# Patient Record
Sex: Female | Born: 1955
Health system: Southern US, Community
[De-identification: ages and names within clinical notes are randomized; demographics above are authoritative.]

## PROBLEM LIST (undated history)

## (undated) DIAGNOSIS — G43109 Migraine with aura, not intractable, without status migrainosus: Secondary | ICD-10-CM

## (undated) DIAGNOSIS — E039 Hypothyroidism, unspecified: Secondary | ICD-10-CM

## (undated) DIAGNOSIS — IMO0002 Reserved for concepts with insufficient information to code with codable children: Secondary | ICD-10-CM

## (undated) DIAGNOSIS — M810 Age-related osteoporosis without current pathological fracture: Secondary | ICD-10-CM

## (undated) DIAGNOSIS — N900 Mild vulvar dysplasia: Secondary | ICD-10-CM

## (undated) HISTORY — DX: Migraine with aura, not intractable, without status migrainosus: G43.109

## (undated) HISTORY — DX: Hypothyroidism, unspecified: E03.9

## (undated) HISTORY — DX: Age-related osteoporosis without current pathological fracture: M81.0

## (undated) HISTORY — DX: Reserved for concepts with insufficient information to code with codable children: IMO0002

## (undated) HISTORY — PX: DILATION AND CURETTAGE OF UTERUS: SHX78

## (undated) HISTORY — DX: Mild vulvar dysplasia: N90.0

---

## 2001-02-20 ENCOUNTER — Other Ambulatory Visit: Admission: RE | Admit: 2001-02-20 | Discharge: 2001-02-20 | Payer: Self-pay | Admitting: *Deleted

## 2002-01-11 DIAGNOSIS — R87619 Unspecified abnormal cytological findings in specimens from cervix uteri: Secondary | ICD-10-CM

## 2002-01-11 DIAGNOSIS — IMO0002 Reserved for concepts with insufficient information to code with codable children: Secondary | ICD-10-CM

## 2002-01-11 HISTORY — DX: Reserved for concepts with insufficient information to code with codable children: IMO0002

## 2002-01-11 HISTORY — DX: Unspecified abnormal cytological findings in specimens from cervix uteri: R87.619

## 2002-02-08 ENCOUNTER — Other Ambulatory Visit: Admission: RE | Admit: 2002-02-08 | Discharge: 2002-02-08 | Payer: Self-pay | Admitting: *Deleted

## 2003-02-18 ENCOUNTER — Other Ambulatory Visit: Admission: RE | Admit: 2003-02-18 | Discharge: 2003-02-18 | Payer: Self-pay | Admitting: Obstetrics and Gynecology

## 2004-02-24 ENCOUNTER — Other Ambulatory Visit: Admission: RE | Admit: 2004-02-24 | Discharge: 2004-02-24 | Payer: Self-pay | Admitting: Obstetrics and Gynecology

## 2005-03-11 ENCOUNTER — Other Ambulatory Visit: Admission: RE | Admit: 2005-03-11 | Discharge: 2005-03-11 | Payer: Self-pay | Admitting: Obstetrics and Gynecology

## 2005-03-27 DIAGNOSIS — Z8739 Personal history of other diseases of the musculoskeletal system and connective tissue: Secondary | ICD-10-CM | POA: Insufficient documentation

## 2006-03-20 ENCOUNTER — Other Ambulatory Visit: Admission: RE | Admit: 2006-03-20 | Discharge: 2006-03-20 | Payer: Self-pay | Admitting: Obstetrics & Gynecology

## 2006-10-12 DIAGNOSIS — N951 Menopausal and female climacteric states: Secondary | ICD-10-CM | POA: Insufficient documentation

## 2007-03-24 ENCOUNTER — Other Ambulatory Visit: Admission: RE | Admit: 2007-03-24 | Discharge: 2007-03-24 | Payer: Self-pay | Admitting: Obstetrics and Gynecology

## 2008-02-29 ENCOUNTER — Encounter: Admission: RE | Admit: 2008-02-29 | Discharge: 2008-02-29 | Payer: Self-pay | Admitting: Family Medicine

## 2008-03-17 ENCOUNTER — Other Ambulatory Visit: Admission: RE | Admit: 2008-03-17 | Discharge: 2008-03-17 | Payer: Self-pay | Admitting: Obstetrics & Gynecology

## 2008-04-25 LAB — HM COLONOSCOPY

## 2009-05-25 ENCOUNTER — Encounter: Admission: RE | Admit: 2009-05-25 | Discharge: 2009-05-25 | Payer: Self-pay | Admitting: Obstetrics and Gynecology

## 2010-06-11 ENCOUNTER — Encounter
Admission: RE | Admit: 2010-06-11 | Discharge: 2010-06-11 | Payer: Self-pay | Source: Home / Self Care | Attending: Family Medicine | Admitting: Family Medicine

## 2011-05-14 HISTORY — PX: FOOT SURGERY: SHX648

## 2012-05-27 ENCOUNTER — Other Ambulatory Visit: Payer: Self-pay | Admitting: Obstetrics and Gynecology

## 2012-05-27 ENCOUNTER — Other Ambulatory Visit: Payer: Self-pay | Admitting: Family Medicine

## 2012-05-27 DIAGNOSIS — Z1231 Encounter for screening mammogram for malignant neoplasm of breast: Secondary | ICD-10-CM

## 2012-06-09 ENCOUNTER — Encounter: Payer: Self-pay | Admitting: *Deleted

## 2012-06-17 ENCOUNTER — Encounter: Payer: Self-pay | Admitting: Obstetrics and Gynecology

## 2012-06-19 ENCOUNTER — Ambulatory Visit
Admission: RE | Admit: 2012-06-19 | Discharge: 2012-06-19 | Disposition: A | Discharge status: Home / Self Care | Payer: BC Managed Care – PPO | Source: Ambulatory Visit | Attending: Obstetrics and Gynecology | Admitting: Obstetrics and Gynecology

## 2012-06-19 DIAGNOSIS — Z1231 Encounter for screening mammogram for malignant neoplasm of breast: Secondary | ICD-10-CM

## 2012-07-30 ENCOUNTER — Encounter: Payer: Self-pay | Admitting: Nurse Practitioner

## 2012-07-30 DIAGNOSIS — J069 Acute upper respiratory infection, unspecified: Secondary | ICD-10-CM | POA: Insufficient documentation

## 2012-07-31 ENCOUNTER — Encounter: Payer: Self-pay | Admitting: Nurse Practitioner

## 2012-07-31 ENCOUNTER — Ambulatory Visit (INDEPENDENT_AMBULATORY_CARE_PROVIDER_SITE_OTHER): Payer: BC Managed Care – PPO | Admitting: Nurse Practitioner

## 2012-07-31 VITALS — BP 106/64 | Ht 63.25 in | Wt 161.6 lb

## 2012-07-31 DIAGNOSIS — Z8739 Personal history of other diseases of the musculoskeletal system and connective tissue: Secondary | ICD-10-CM

## 2012-07-31 DIAGNOSIS — N951 Menopausal and female climacteric states: Secondary | ICD-10-CM

## 2012-07-31 DIAGNOSIS — Z01419 Encounter for gynecological examination (general) (routine) without abnormal findings: Secondary | ICD-10-CM

## 2012-07-31 DIAGNOSIS — J069 Acute upper respiratory infection, unspecified: Secondary | ICD-10-CM

## 2012-07-31 MED ORDER — AZITHROMYCIN 250 MG PO TABS: ORAL_TABLET | ORAL | Status: DC

## 2012-07-31 NOTE — Progress Notes (Signed)
57 y.o. MG1P1Caucasian Fe here for annual exam.  Some problems with vaso symptoms that are mostly tolerable. Still has some insomnia that is chronic.Takes no medications. Still some vaginal dryness. OTC lubrications prn. Recent URI symptoms X 1 month. Chest congestion and cough. Main lingering symptom is cough. No current fever. OTC Dayquil and Nyquil.  She doesn't have appointment. with PCP until next month. Request medication treatment.   Daughter is now pregnant and will be returning to the states from [place]. She had triplets in the past. Husband is no longer driving as truck driver and has returned to [employer] and she is much happier with this choice.  Patient's last menstrual period was 10/12/2006.          Sexually active: yes  The current method of family planning is post menopausal status.    Exercising: yes  patient states exercising very little. Smoker:  no  Health Maintenance: Pap:  07/29/11 normal with negative HR HPV. MMG:  06/19/12 normal  Colonoscopy:  2010 [facility] return in 10 years. BMD:   11/06  TDaP:  Patient will check with PCP next month.    Past Medical History  Diagnosis Date  . Osteoporosis   . Abnormal pap 01/2002    cervicitis    Past Surgical History  Procedure Laterality Date  . Foot surgery  2013    left foot    Current Outpatient Prescriptions  Medication Sig Dispense Refill  . Calcium 1200-1000 MG-UNIT CHEW Chew by mouth.      . Cholecalciferol (VITAMIN D-3) 1000 UNITS CAPS Take by mouth.      Marland Kitchen FLUoxetine (PROZAC) 10 MG capsule Take 10 mg by mouth daily.      Marland Kitchen lovastatin (MEVACOR) 40 MG tablet Take 40 mg by mouth at bedtime.      . Omega-3 Fatty Acids (FISH OIL PO) Take by mouth.      . propranolol (INDERAL) 10 MG tablet Take 10 mg by mouth daily.       . SUMATRIPTAN SUCCINATE PO Take by mouth.       No current facility-administered medications for this visit.    Family History  Problem Relation Age of Onset  . Cancer  Father     skin  . Diabetes Maternal Uncle   . Cancer Maternal Grandfather     colon    ROS:  Pertinent items are noted in HPI.  Otherwise, a comprehensive ROS was negative.  Exam:   BP 106/64  Ht 5' 3.25" (1.607 m)  Wt 161 lb 9.6 oz (73.301 kg)  BMI 28.38 kg/m2  LMP 10/12/2006  Weight change: of 1 lb. Height:   Height: 5' 3.25" (160.7 cm)  Ht Readings from Last 3 Encounters:  07/31/12 5' 3.25" (1.607 m)    General appearance: alert, cooperative and appears stated age Head: Normocephalic, without obvious abnormality, atraumatic Neck: no adenopathy, supple, symmetrical, trachea midline and thyroid not enlarged, symmetric, no tenderness/mass/nodules Lungs: Bilateral rhonchi with a loose congested cough. No rales. Breasts: Inspection negative, No nipple retraction or dimpling, No nipple discharge or bleeding, No axillary or supraclavicular adenopathy, Normal to palpation without dominant masses Heart: regular rate and rhythm Abdomen: soft, non-tender; bowel sounds normal; no masses,  no organomegaly Extremities: extremities normal, atraumatic, no cyanosis or edema Skin: Skin color, texture, turgor normal. No rashes or lesions Lymph nodes: Cervical, supraclavicular, and axillary nodes normal. No abnormal inguinal nodes palpated Neurologic: Grossly normal   Pelvic: External genitalia:  no lesions. Right  labia as previously noted is larger in size and fullness without inflammation.               Urethra:  normal appearing urethra with no masses, tenderness or lesions              Bartholin's and Skene's: normal                 Vagina: normal appearing vagina with normal color and discharge, no lesions              Cervix: anteverted              Pap taken: no Bimanual Exam:  Uterus:  normal              Adnexa: normal adnexa               Rectovaginal: Confirms               Anus:  normal sphincter tone, no lesions  A:  Well Woman with normal exam  Recent lingering URI Symptoms  with Cough  P:   Mammogram due 06/2013  pap smear as indicated  Return annually or prn  Zithromax Z pack #6 given for URI symptoms and will see PCP if symptoms are not resolved / or worsen.  An After Visit Summary was printed and given to the patient.

## 2012-07-31 NOTE — Patient Instructions (Addendum)
Zithromax Z Pack # 6 tabs take as directed.  If cough or URI symptoms persist to call PCP for further evaluation.

## 2012-08-03 ENCOUNTER — Telehealth: Payer: Self-pay | Admitting: Obstetrics & Gynecology

## 2012-08-03 NOTE — Telephone Encounter (Signed)
PT RETURNING KELLY'S CALL/PT NOT SURE WHY KELLY CALLED?/[place]

## 2012-08-04 NOTE — Telephone Encounter (Signed)
Patient notified of BMD appointment. Will call her insurance to make sure in network and call back with info.

## 2012-08-06 NOTE — Telephone Encounter (Signed)
appt for 08/10/12 at 9 am has been cancelled, will wait to hear from patient so can reschedule.

## 2012-08-06 NOTE — Telephone Encounter (Signed)
Can we keep this in file for flagged if we don't hear from her or send back to medical records until she calls again?

## 2012-08-06 NOTE — Telephone Encounter (Signed)
PT RETURNED KELLY'S CALL AND STATES [facility] IS NOT IN NETWORK WITH HER INS/PT WILL RESEARCH THEN GET BACK TO US/[place]

## 2012-08-11 NOTE — Progress Notes (Signed)
Encounter reviewed by Dr. Janus Vlcek Silva.  

## 2012-08-31 ENCOUNTER — Telehealth: Payer: Self-pay | Admitting: *Deleted

## 2012-08-31 NOTE — Telephone Encounter (Signed)
Left message on work # of need to call office regarding appt. She was to check on for insurance for her Bone density test .

## 2012-09-03 ENCOUNTER — Telehealth: Payer: Self-pay | Admitting: *Deleted

## 2012-09-03 NOTE — Telephone Encounter (Signed)
PATIENT AWARE OF NEED FOR BONE DENSITY EXAM. IS TO CHECK WITH INSURANCE BEFORE TEST TO BE DONE. APPT. AT [facility] WAS CANCELLED. FYI PATTY GRUBB/ SUE

## 2012-10-28 NOTE — Telephone Encounter (Signed)
PG, this patient has not called back with any information that I know of. Please advise.

## 2013-05-03 ENCOUNTER — Encounter: Payer: Self-pay | Admitting: Nurse Practitioner

## 2013-06-16 ENCOUNTER — Telehealth: Payer: Self-pay | Admitting: Nurse Practitioner

## 2013-06-16 DIAGNOSIS — Z8739 Personal history of other diseases of the musculoskeletal system and connective tissue: Secondary | ICD-10-CM

## 2013-06-16 NOTE — Telephone Encounter (Signed)
Patient needs an order faxed for her Bone Density test. [facility]   fx :[phone]

## 2013-06-17 NOTE — Telephone Encounter (Signed)
Message left to return call to Deborah Richards at [phone].    

## 2013-06-17 NOTE — Telephone Encounter (Signed)
Patty, per your notes, last BMD 03/2005, [facility] to [facility]. Patient with hx of osteopenia.

## 2013-06-21 ENCOUNTER — Other Ambulatory Visit: Payer: Self-pay

## 2013-06-21 DIAGNOSIS — Z1231 Encounter for screening mammogram for malignant neoplasm of breast: Secondary | ICD-10-CM

## 2013-06-28 NOTE — Telephone Encounter (Signed)
Patient has scheduled Dexa.  Routing to provider for final review. Patient agreeable to disposition. Will close encounter

## 2013-07-05 ENCOUNTER — Ambulatory Visit
Admission: RE | Admit: 2013-07-05 | Discharge: 2013-07-05 | Disposition: A | Discharge status: Home / Self Care | Payer: Self-pay | Source: Ambulatory Visit

## 2013-07-05 ENCOUNTER — Ambulatory Visit
Admission: RE | Admit: 2013-07-05 | Discharge: 2013-07-05 | Disposition: A | Discharge status: Home / Self Care | Payer: Self-pay | Source: Ambulatory Visit | Attending: Nurse Practitioner | Admitting: Nurse Practitioner

## 2013-07-05 DIAGNOSIS — Z1231 Encounter for screening mammogram for malignant neoplasm of breast: Secondary | ICD-10-CM

## 2013-07-05 DIAGNOSIS — Z8739 Personal history of other diseases of the musculoskeletal system and connective tissue: Secondary | ICD-10-CM

## 2013-08-05 ENCOUNTER — Ambulatory Visit: Payer: BC Managed Care – PPO | Admitting: Nurse Practitioner

## 2013-08-09 ENCOUNTER — Encounter: Payer: Self-pay | Admitting: Nurse Practitioner

## 2013-08-09 ENCOUNTER — Ambulatory Visit (INDEPENDENT_AMBULATORY_CARE_PROVIDER_SITE_OTHER): Payer: BC Managed Care – PPO | Admitting: Nurse Practitioner

## 2013-08-09 VITALS — BP 96/64 | HR 64 | Ht 63.5 in | Wt 178.0 lb

## 2013-08-09 DIAGNOSIS — Z01419 Encounter for gynecological examination (general) (routine) without abnormal findings: Secondary | ICD-10-CM

## 2013-08-09 DIAGNOSIS — N9089 Other specified noninflammatory disorders of vulva and perineum: Secondary | ICD-10-CM

## 2013-08-09 DIAGNOSIS — Z0000 Encounter for general adult medical examination without abnormal findings: Secondary | ICD-10-CM

## 2013-08-09 DIAGNOSIS — Z1211 Encounter for screening for malignant neoplasm of colon: Secondary | ICD-10-CM

## 2013-08-09 LAB — POCT URINALYSIS DIPSTICK
Bilirubin, UA: NEGATIVE
Blood, UA: NEGATIVE
Glucose, UA: NEGATIVE
Ketones, UA: NEGATIVE
Leukocytes, UA: NEGATIVE
Nitrite, UA: NEGATIVE
Protein, UA: NEGATIVE
Urobilinogen, UA: NEGATIVE
pH, UA: 5

## 2013-08-09 NOTE — Progress Notes (Signed)
Patient ID: Deborah Richards, female   DOB: 08/15/1955, 58 y.o.   MRN: [id number] 58 y.o. G1P1 Married Caucasian Fe here for annual exam.  Some increase in migraine that seem weather related. Some vaginal dryness better with OTC lubrication.   Patient's last menstrual period was 10/12/2006.          Sexually active: yes  The current method of family planning is post menopausal status.    Exercising: no  The patient does not participate in regular exercise at present. Smoker:  no  Health Maintenance: Pap:  07/29/11, WNL, neg HR HPV MMG:  07/05/13, Bi-Rads 1:  Negative Colonoscopy:  2010, repeat in 10 years BMD:  07/05/13, Osteopenia TDaP:  ? Will check with PCP Labs:  HB:  PCP Urine:  Negative    reports that she quit smoking about 4 years ago. Her smoking use included Cigarettes. She has a 15 pack-year smoking history. She has never used smokeless tobacco. She reports that she drinks about 0.5 ounces of alcohol per week. She reports that she does not use illicit drugs.  Past Medical History  Diagnosis Date  . Abnormal pap 01/2002    cervicitis  . Osteoporosis     never has taken medication tx.     Past Surgical History  Procedure Laterality Date  . Foot surgery  2013    left foot plantar fascitis reconstuction of great toe    Current Outpatient Prescriptions  Medication Sig Dispense Refill  . Calcium 1200-1000 MG-UNIT CHEW Chew by mouth.      . Cholecalciferol (VITAMIN D-3) 1000 UNITS CAPS Take by mouth.      Marland Kitchen. FLUoxetine (PROZAC) 10 MG capsule Take 10 mg by mouth daily.      Marland Kitchen. levothyroxine (SYNTHROID, LEVOTHROID) 75 MCG tablet Take 1 tablet by mouth daily.      Marland Kitchen. lovastatin (MEVACOR) 40 MG tablet Take 40 mg by mouth at bedtime.      . Omega-3 Fatty Acids (FISH OIL PO) Take by mouth.      . SUMAtriptan (IMITREX) 100 MG tablet Take 1 tablet by mouth once.        No current facility-administered medications for this visit.    Family History  Problem Relation Age of Onset  .  Cancer Father     skin  . Diabetes Maternal Uncle   . Cancer - Colon Maternal Grandfather     colon    ROS:  Pertinent items are noted in HPI.  Otherwise, a comprehensive ROS was negative.  Exam:   BP 96/64  Pulse 64  Ht 5' 3.5" (1.613 m)  Wt 178 lb (80.74 kg)  BMI 31.03 kg/m2  LMP 10/12/2006 Height: 5' 3.5" (161.3 cm)  Ht Readings from Last 3 Encounters:  08/09/13 5' 3.5" (1.613 m)  07/31/12 5' 3.25" (1.607 m)    General appearance: alert, cooperative and appears stated age Head: Normocephalic, without obvious abnormality, atraumatic Neck: no adenopathy, supple, symmetrical, trachea midline and thyroid normal to inspection and palpation Lungs: clear to auscultation bilaterally Breasts: normal appearance, no masses or tenderness Heart: regular rate and rhythm Abdomen: soft, non-tender; no masses,  no organomegaly Extremities: extremities normal, atraumatic, no cyanosis or edema Skin: Skin color, texture, turgor normal. No rashes or lesions Lymph nodes: Cervical, supraclavicular, and axillary nodes normal. No abnormal inguinal nodes palpated Neurologic: Grossly normal   Pelvic: External genitalia:  There has always been a raised but normal looking lesion on right side of labia.  This year looks more  prominent and darker at one area.                      Urethra:  normal appearing urethra with no masses, tenderness or lesions              Bartholin's and Skene's: normal                 Vagina: normal appearing vagina with normal color and discharge, no lesions              Cervix: no bleeding following Pap              Pap taken: no Bimanual Exam:  Uterus:  normal size, contour, position, consistency, mobility, non-tender              Adnexa: no mass, fullness, tenderness               Rectovaginal: Confirms               Anus:  normal sphincter tone, no lesions  A:  Well Woman with normal exam  Postmenopausal  Skin lesion with changes since last year - little  larger and dark at one area  History of hypothyroid, migraine,   P:   Pap smear as per guidelines  not done  Mammogram due 06/2014  Will schedule vulvar biopsy - (may not need)  IFOB is given  Counseled on breast self exam, mammography screening, adequate intake of calcium and vitamin D, diet and exercise return annually or prn  An After Visit Summary was printed and given to the patient.

## 2013-08-09 NOTE — Patient Instructions (Addendum)

## 2013-08-10 NOTE — Progress Notes (Signed)
Encounter reviewed by Dr. Brook Silva.  

## 2013-08-12 ENCOUNTER — Telehealth: Payer: Self-pay | Admitting: Nurse Practitioner

## 2013-08-12 NOTE — Telephone Encounter (Signed)
Spoke with patient. Advised patient of $25 copay quoted as patient liability for Biopsy Vulva.  Patient wasn't sure that the procedure is actually being rendered or if she needs to come in and see physician before this determination is made. Advised that I would have Patty call her.

## 2013-08-12 NOTE — Telephone Encounter (Signed)
Judeth CornfieldStephanie, can you let patient know:  On the day she comes in a decision will be made if needs removal or not - so in preparation we get insurance authorization.

## 2013-08-18 NOTE — Telephone Encounter (Signed)
Notified pt [facility]

## 2013-08-23 ENCOUNTER — Telehealth: Payer: Self-pay | Admitting: Nurse Practitioner

## 2013-08-23 NOTE — Telephone Encounter (Signed)
Left message that 4:30 appointment on Tues, April 14th has been cancelled. Advised patient to call back so that appointment can be rescheduled.

## 2013-08-23 NOTE — Telephone Encounter (Signed)
Spoke with patient. Patient prefers late afternoon appointment. Rescheduled with Dr. Farrel GobbleLathrop

## 2013-08-24 ENCOUNTER — Institutional Professional Consult (permissible substitution): Payer: BC Managed Care – PPO | Admitting: Obstetrics & Gynecology

## 2013-08-30 ENCOUNTER — Encounter: Payer: Self-pay | Admitting: Gynecology

## 2013-09-22 ENCOUNTER — Institutional Professional Consult (permissible substitution): Payer: BC Managed Care – PPO | Admitting: Gynecology

## 2013-09-27 ENCOUNTER — Institutional Professional Consult (permissible substitution): Payer: BC Managed Care – PPO | Admitting: Gynecology

## 2013-09-29 ENCOUNTER — Encounter: Payer: Self-pay | Admitting: Gynecology

## 2013-09-29 ENCOUNTER — Ambulatory Visit (INDEPENDENT_AMBULATORY_CARE_PROVIDER_SITE_OTHER): Payer: BC Managed Care – PPO | Admitting: Gynecology

## 2013-09-29 VITALS — BP 110/66 | HR 68 | Resp 18 | Ht 63.5 in | Wt 181.0 lb

## 2013-09-29 DIAGNOSIS — N9089 Other specified noninflammatory disorders of vulva and perineum: Secondary | ICD-10-CM

## 2013-09-29 NOTE — Progress Notes (Signed)
Pt here for labial lesion, pt has been followed for a number of years and it was felt to look different.  Pt reports having 1 child, forcep delivery.    .AVSS   Based on reported change in appearance, recommend biopsy, consent obtained Area prepped with xylocaine jelly 2%, cleansed with betadine, injected with 1cc 1% lidocaine and marciane.  Biopsy sharply taken with blade. Base treated with AgNO3 Hemostatic. Pt tolerated well Tissue to pathology

## 2013-09-29 NOTE — Patient Instructions (Signed)
Blot when urinating, pantishield today and tomorrow No sex 1w

## 2013-10-05 LAB — IPS OTHER TISSUE BIOPSY

## 2013-10-07 ENCOUNTER — Telehealth: Payer: Self-pay | Admitting: Nurse Practitioner

## 2013-10-07 NOTE — Telephone Encounter (Signed)
Dr. Farrel Gobble, this is a PG pt you did a vulvar bx on 09-29-13. Results are ready to view.

## 2013-10-07 NOTE — Telephone Encounter (Signed)
Patient is calling to see when she is going to get results from procedure on 09/29/13

## 2013-10-11 NOTE — Telephone Encounter (Signed)
Patient calling to check on status of results °

## 2013-10-11 NOTE — Telephone Encounter (Signed)
Message copied by Alisa Graff on Mon Oct 11, 2013  4:41 PM ------      Message from: Douglass Rivers      Created: Mon Oct 11, 2013 12:46 PM       Inform biopsy is mild dysplasia, we can either treat topically or remove the remaining tissue in the office ------

## 2013-10-11 NOTE — Telephone Encounter (Signed)
Call to work and cell numbers, LMTCB. Per ROI, leave detailed message on home number [phone]. LM calling with good news, nothing wrong. LMTCB.

## 2013-10-12 NOTE — Telephone Encounter (Signed)
Recommend she come in and we go over what excision would entail, she might prefer that or not do anything but watch since it was low grade

## 2013-10-12 NOTE — Telephone Encounter (Signed)
Left message to call Kaitlyn at [phone]. 

## 2013-10-12 NOTE — Telephone Encounter (Signed)
Spoke with patient. Advised of results as seen below. Patient would like to treat topically but states " I do not want to treat this topically and then it reoccur in a couple of years. I am just now feeling better from the biopsy. It was uncomfortable for about a week and I feel like this would be worse. I need to know how long it would have for this to get better and what exactly it entails." Patient would like to know what Dr.Lathrop recommends for the most effective and reliable treatment. Advised patient would send a message to Dr.Lathrop and give patient a call back with further instructions and recommendations.

## 2013-10-14 NOTE — Telephone Encounter (Signed)
Spoke with patient. Advised of message as seen below from Dr.Lathrop. Patient agreeable. Appointment scheduled for 6/5 at 1300 with Dr.Lathrop. Patient agreeable to date and time.  Routing to provider for final review. Patient agreeable to disposition. Will close encounter

## 2013-10-15 ENCOUNTER — Ambulatory Visit (INDEPENDENT_AMBULATORY_CARE_PROVIDER_SITE_OTHER): Payer: BC Managed Care – PPO | Admitting: Gynecology

## 2013-10-15 ENCOUNTER — Encounter: Payer: Self-pay | Admitting: Gynecology

## 2013-10-15 VITALS — BP 108/68 | HR 74 | Resp 12 | Ht 63.5 in | Wt 181.0 lb

## 2013-10-15 DIAGNOSIS — N900 Mild vulvar dysplasia: Secondary | ICD-10-CM

## 2013-10-15 NOTE — Progress Notes (Signed)
Subjective:     Patient ID: Deborah Richards, female   DOB: 09-Sep-1955, 58 y.o.   MRN: [id number]  HPI Comments: Pt here after vulvar biopsy and discuss follow up. Lesion thought to be residual labia minora after traumatic forcep delivery but pathology showed mild dysplasia with HPV affect. Pt had abnormal PAP in 2006 but not since, no dysplasia. Pt states area has healed, no issues since biopsy    Review of Systems Per HPI    Objective:   Physical Exam  Nursing note and vitals reviewed. Constitutional: She is oriented to person, place, and time. She appears well-developed and well-nourished.  Cardiovascular: Normal rate, regular rhythm and normal heart sounds.   No murmur heard. Pulmonary/Chest: Effort normal and breath sounds normal. No respiratory distress. She has no wheezes. She has no rales.  Genitourinary:     Neurological: She is alert and oriented to person, place, and time.  Skin: Skin is warm and dry.       Assessment:     Mild dysplasia-vulvar     Plan:     We reviewed her treatment options-do nothing, excise in OR or office or topical treatment Pt shown area of concern We reviewed HPV and different dysplasia implications Pt prefers to have removed, prefers OR. Procedure outlined, post-operative course and restrictions Risks of bleeding, infection.   Recommend sitz baths post-op Pt will review her availability and contact office back Questions addressed 40m spent counseling, >50% face to face

## 2013-10-26 ENCOUNTER — Telehealth: Payer: Self-pay | Admitting: Gynecology

## 2013-10-26 NOTE — Telephone Encounter (Signed)
Left message with female that answered the phone for patient to call back. Need to go over surgery benefits.

## 2013-10-27 NOTE — Telephone Encounter (Signed)
Patient returned call. Advised that per benefit quote received, she would be responsible for $526.09 for surgeons portion of outpatient surgery. Advised that payment is due in full at least 2 weeks prior to scheduled surgery date. Patient mentioned that she had discussed having the procedure performed in the office with Dr Farrel GobbleLathrop. She asked what oop would be if performed in office, advised that oop would then approximately be $635.98. Patient asked for # to contact hospital directly regarding surgery costs. Provided her with # for [phone] and cpt code [phone]. Patient would like to be contacted after July 1st to discuss scheduling and to make determination as to whether the surgery will be performed in hospital or in-office.

## 2013-11-01 NOTE — Telephone Encounter (Signed)
Patient is calling Deborah Richards back °

## 2013-11-03 ENCOUNTER — Telehealth: Payer: Self-pay | Admitting: *Deleted

## 2013-11-03 NOTE — Telephone Encounter (Signed)
See next phone note.  Routing to provider for final review. Patient agreeable to disposition. Will close encounter.    

## 2013-11-03 NOTE — Telephone Encounter (Signed)
Patient calling to schedule in-office procedure instead of hospital procedure.  Has checked with insurance and due to cost, prefers to have done in office (this was initial recommendation).  States she was only having at hospital because she preferred "to be knocked out" but isn't willing to incur so much cost "just because she is scared." Advised will confirm with MD that can proceed with scheduling here and check on ability to prescribe something for relaxation during procedure.  Please advise.

## 2013-11-05 NOTE — Telephone Encounter (Signed)
Reviewed with Dr Farrel GobbleLathrop. She is agreeable to schedule in office but will need to reassess before beginning procedure. Patient is agreeable. She is aware she will need driver if desires medication for relaxation. She will call me back next week to schedule.  Routing to provider for final review. Patient agreeable to disposition. Will close encounter

## 2013-11-15 NOTE — Telephone Encounter (Signed)
Follow-up call to patient regarding scheduling appointment, LMTCB.

## 2013-11-23 ENCOUNTER — Telehealth: Payer: Self-pay | Admitting: *Deleted

## 2013-11-23 NOTE — Telephone Encounter (Signed)
Call to patient, LMTCB on home and cell numbers.

## 2013-11-23 NOTE — Telephone Encounter (Signed)
See next phone note with effort to call patient for scheduling.  Routing to provider for final review. Patient agreeable to disposition. Will close encounter

## 2013-11-24 NOTE — Telephone Encounter (Signed)
Patient returning call to schedule procedure. Had family in for extended visit and was not yet able to schedule.  Discussed that will need driver if has medication for sedation. Patient would prefer to come in and sign consent ahead of time and be able to take med before arriving at office. Procedure scheduled for 12-07-13 at 0930. Send RX to [facility].

## 2013-11-26 NOTE — Telephone Encounter (Signed)
Patient calls back, appt to discuss procedure and sign consent scheduled for Monday 11-30-13 at 1030.  Routing to provider for final review. Patient agreeable to disposition. Will close encounter

## 2013-11-26 NOTE — Telephone Encounter (Signed)
Reviewed with Dr Farrel GobbleLathrop. Need to see patient and discuss before signing consent.  After consent signed, will give RX if decide to proceed. 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

## 2013-11-30 ENCOUNTER — Encounter: Payer: Self-pay | Admitting: Gynecology

## 2013-11-30 ENCOUNTER — Ambulatory Visit (INDEPENDENT_AMBULATORY_CARE_PROVIDER_SITE_OTHER): Payer: BC Managed Care – PPO | Admitting: Gynecology

## 2013-11-30 VITALS — BP 110/70 | HR 64 | Resp 18 | Ht 63.5 in | Wt 182.0 lb

## 2013-11-30 DIAGNOSIS — N9089 Other specified noninflammatory disorders of vulva and perineum: Secondary | ICD-10-CM

## 2013-11-30 DIAGNOSIS — N900 Mild vulvar dysplasia: Secondary | ICD-10-CM

## 2013-11-30 MED ORDER — LIDOCAINE 5 % EX OINT: 1.0000 "application " | TOPICAL_OINTMENT | Freq: Three times a day (TID) | CUTANEOUS | Status: DC

## 2013-11-30 MED ORDER — LORAZEPAM 1 MG PO TABS: ORAL_TABLET | ORAL | Status: DC

## 2013-11-30 MED ORDER — CEPHALEXIN 500 MG PO CAPS: 500.0000 mg | ORAL_CAPSULE | Freq: Four times a day (QID) | ORAL | Status: DC

## 2013-11-30 MED ORDER — TRAMADOL HCL ER 100 MG PO TB24: 100.0000 mg | ORAL_TABLET | Freq: Every day | ORAL | Status: DC

## 2013-11-30 NOTE — Progress Notes (Signed)
Pt here to reassess her vulvar biospy for VIN I,  Pt had wanted to try to do in office in lieu of hospital.  Pt requesting anti-anxiety rx for procedure in office. We reviewed the OR vs office difference and she prefers the convenience of office.  PMHx and Surg Hx reviewed-no contraindication Meds/Allergies: reviewed  BP 110/70  Pulse 64  Resp 18  Ht 5' 3.5" (1.613 m)  Wt 182 lb (82.555 kg)  BMI 31.73 kg/m2  LMP 10/12/2006 General appearance: alert, cooperative and appears stated age  Risks and benefits of removal in office reviewed, risks of bleeding, infection, wound breakdown, need for re-excision for + margin. Post-operative pain reviewed, post-operative course. Questions addressed, comfortable with decision to do in office Pt will be given ativan and keflex today Consent obtained today.  Husband will come the day of excision  6968m spent counseling, >50% face to face

## 2013-11-30 NOTE — Patient Instructions (Addendum)
Come with ride to offcie visit Pick up before procedure: Sitz bath 4x4 gauze Bacitracin ointment Wash for 2 nights with hibiclens solution  Blow dry area of cool setting while sutures are in  Sitz baths 4x/d

## 2013-12-07 ENCOUNTER — Ambulatory Visit (INDEPENDENT_AMBULATORY_CARE_PROVIDER_SITE_OTHER): Payer: BC Managed Care – PPO | Admitting: Gynecology

## 2013-12-07 ENCOUNTER — Encounter: Payer: Self-pay | Admitting: Gynecology

## 2013-12-07 VITALS — BP 108/62 | Resp 16 | Ht 63.5 in | Wt 180.0 lb

## 2013-12-07 DIAGNOSIS — N900 Mild vulvar dysplasia: Secondary | ICD-10-CM

## 2013-12-07 HISTORY — DX: Mild vulvar dysplasia: N90.0

## 2013-12-07 NOTE — Progress Notes (Signed)
Pt here for WLE of VIN I vulvar lesion.  Pt elected for office removal, she has taken ativan 1h before and is relaxed but alert and cooperative.  ROS: negative Colposcopy of vulva performed: No h/o abnormal pap, last HPV negative 2013. Vulva bathed with acetic acid, inspected with 3.75 and 7.5x magnification, green field filter and lugol's solution. No abnormal vessels, AWE, lugol's absent areas noted. Normal colpo. No additional biopsies needed.  WLE: Area of concern on left vulva injected with 10cc total equal parts 2%lidocaine w/ epi/0.25% marcaine. Under sterile conditions, area of concern elevated and sharply excised. Deep and skin sutures of 3.0 vicryl interrupted to reapproximate dead space and skin.  Pt tolerated well Gauze placed over inicsion and she will try to keep with pressure for today Reviewed recommendations given earlier-sitz baths and xylocaine jelly Ultram prn Keflex all given earlier

## 2013-12-09 LAB — IPS OTHER TISSUE BIOPSY

## 2013-12-15 ENCOUNTER — Encounter: Payer: Self-pay | Admitting: Gynecology

## 2013-12-15 ENCOUNTER — Ambulatory Visit (INDEPENDENT_AMBULATORY_CARE_PROVIDER_SITE_OTHER): Payer: BC Managed Care – PPO | Admitting: Gynecology

## 2013-12-15 ENCOUNTER — Telehealth: Payer: Self-pay | Admitting: Emergency Medicine

## 2013-12-15 VITALS — BP 98/64 | Resp 14 | Ht 63.5 in | Wt 181.0 lb

## 2013-12-15 DIAGNOSIS — N900 Mild vulvar dysplasia: Secondary | ICD-10-CM

## 2013-12-15 NOTE — Telephone Encounter (Signed)
Kim calling from [facility] to confirm appointment. is 12/22/13 @12 :15 with Dr. Morene Crockerossi  Ok? Please return a call to Kim@ [phone]

## 2013-12-15 NOTE — Addendum Note (Signed)
Addended by: Douglass RiversLATHROP, Murry Diaz on: 12/15/2013 11:49 AM   Modules accepted: Orders

## 2013-12-15 NOTE — Telephone Encounter (Signed)
Message [facility] for return call to initiate referral for patient to be seen by Dr. Andrey Farmerossi for second opinion.  Referral is placed.

## 2013-12-15 NOTE — Progress Notes (Signed)
Subjective:     Patient ID: Deborah Richards, female   DOB: 10/18/1955, 58 y.o.   MRN: [id number]  HPI Comments: Pt here for f/u of WLE for VIN I.  Negative colposcopy.   Pt is without complaints, did sitz baths after biopsy, minimal bleeding or pain     Review of Systems  Genitourinary: Negative for vaginal discharge and vaginal pain.       Objective:   Physical Exam  Nursing note and vitals reviewed. Constitutional: She is oriented to person, place, and time. She appears well-developed and well-nourished.  Genitourinary:     Neurological: She is alert and oriented to person, place, and time.       Assessment:     VIN I s/p WLE     Plan:     Pt did well after biopsy Pt informed re + margin of sample, she was aware of potential for repeat excision due to margin Pathology was VIN I We will ask for second option from gyn-onc re best f/u as no colposcopic changes were noted Pt agrees to plan Sutures removed

## 2013-12-15 NOTE — Telephone Encounter (Signed)
You now have an appointment with the Gynecologic Oncologist at the [facility] at [facility]. The appointment is scheduled for 12/22/13 at 1215 with Dr. Adolphus BirchwoodEmma Rossi  They would like you to arrive 30 minutes early for registration. Please bring your photo identification card and a copy of your insurance card. Please be aware that the Doctor will complete a pelvic exam on this day as well.   The address is: [facility] [address] [place], [place] [id number]  If this appointment time will not work for you, please let our office know and we can have the appointment changed or you may also call [phone] for their office directly.   Patient is notified of above and agreeable to follow up.   Routing to provider for final review. Patient agreeable to disposition. Will close encounter  Message left for Kim at Gyn Onc to advise patient accepts this appointment.

## 2013-12-22 ENCOUNTER — Encounter: Payer: Self-pay | Admitting: Gynecologic Oncology

## 2013-12-22 ENCOUNTER — Ambulatory Visit: Payer: BC Managed Care – PPO | Attending: Gynecologic Oncology | Admitting: Gynecologic Oncology

## 2013-12-22 VITALS — BP 131/64 | HR 68 | Temp 98.5°F | Resp 18 | Ht 63.0 in | Wt 179.6 lb

## 2013-12-22 DIAGNOSIS — N900 Mild vulvar dysplasia: Secondary | ICD-10-CM | POA: Diagnosis not present

## 2013-12-22 DIAGNOSIS — G43909 Migraine, unspecified, not intractable, without status migrainosus: Secondary | ICD-10-CM | POA: Diagnosis not present

## 2013-12-22 DIAGNOSIS — Z79899 Other long term (current) drug therapy: Secondary | ICD-10-CM | POA: Diagnosis not present

## 2013-12-22 DIAGNOSIS — E039 Hypothyroidism, unspecified: Secondary | ICD-10-CM | POA: Diagnosis not present

## 2013-12-22 DIAGNOSIS — Z87891 Personal history of nicotine dependence: Secondary | ICD-10-CM | POA: Insufficient documentation

## 2013-12-22 NOTE — Progress Notes (Signed)
Consult Note: Gyn-Onc  Consult was requested by Dr. Farrel Gobble for the evaluation of Deborah Richards 58 y.o. female  CC: VIN 1 on vulvar biopsy with positive margins  Assessment/Plan:  [name]  is a 58 y.o.  year old VIN 1 of the right posterior vulva with positive margins on excisional biopsy.  It appears that [name] also has a second lesion on the right closer to the introitus, which looks most consistent with mild dysplasia, however we will follow up the results of today's biopsy to ensure moderate or high-grade dysplasia is not present. Given that she has no symptoms from her dysplasia (such as pruritus, irritation, bleeding) provided she has only evidence of VIN 1, she does not require additional excisional procedures either of her original excisional biopsy site, for this secondary area.  I discussed with [name] that there is approximately 10% risk for progression from VIN 1 to VIN 2 or 3 and potentially to vulvar cancer. Therefore it is important that she continue surveillance exams (with acetic acid application to vulva). I am recommending these take place every 6 months until she has no evidence of persistent VIN. I discussed that this process is related to the HPV virus that caused a remote history of abnormal Pap smears. Therefore it is important she continues to have vaginal and cervical surveillance as well.   HPI: [name] is a 58 year old woman who is seen in consultation at the request of Dr. Farrel Gobble for a positive margin on a surgical excision of the eye in one. This lesion was identified on routine annual pelvic examination. [name] has no symptoms of vulvar dysplasia including no pruritus, irritation, palpable mass, or bleeding. She does have a remote history of multiple abnormal Pap smears which sound consistent with mild or moderate dysplasia of the cervix and these have been treated (for the 15 years ago) with cervical cryotherapy. She's had regular annual  visits in the past 15 years with no abnormal cytologic evaluation.  When an area of leukoplakia was identified on [name] right labia minora and perineal body in July 2015 it was biopsied and shown to be VIN 1. On 12/08/2013.Dr. Farrel Gobble performed an excisional biopsy which revealed VIN 1 with a positive margin. The patient healed well from her biopsy and denies irritation or pain. She had not noted the area of white changes and has no inspected the vulva since the procedure.  Of note, [name] no longer smokes. She quit 5 years ago but was a heavy smoker prior to that point in time.  Interval History: she is healing well from her excisional biopsy with no symptoms.  Current Meds:  Outpatient Encounter Prescriptions as of 12/22/2013  Medication Sig  . Calcium 1200-1000 MG-UNIT CHEW Chew by mouth.  . Cholecalciferol (VITAMIN D-3) 1000 UNITS CAPS Take by mouth.  Marland Kitchen FLUoxetine (PROZAC) 10 MG capsule Take 10 mg by mouth daily.  Marland Kitchen levothyroxine (SYNTHROID, LEVOTHROID) 75 MCG tablet Take 1 tablet by mouth daily.  Marland Kitchen lidocaine (XYLOCAINE) 5 % ointment Apply 1 application topically 3 (three) times daily.  Marland Kitchen LORazepam (ATIVAN) 1 MG tablet   . lovastatin (MEVACOR) 40 MG tablet Take 40 mg by mouth at bedtime.  . Omega-3 Fatty Acids (FISH OIL PO) Take by mouth.  . propranolol ER (INDERAL LA) 60 MG 24 hr capsule Take 60 mg by mouth daily.   . SUMAtriptan (IMITREX) 100 MG tablet Take 1 tablet by mouth as needed.   . traMADol (ULTRAM-ER) 100  MG 24 hr tablet   . [DISCONTINUED] cephALEXin (KEFLEX) 500 MG capsule Take 1 capsule (500 mg total) by mouth 4 (four) times daily. Take QID for 7 days.    Allergy:  Allergies  Allergen Reactions  . Other Nausea And Vomiting    ergomite    Social Hx:   History   Social History  . Marital Status: Married    Spouse Name: Vikki PortsMark Huisman    Number of Children: 1  . 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 Smoking status: Former Smoker -- 0.50 packs/day for 30 years    Types: Cigarettes    Quit date: 09/10/2008  . Smokeless tobacco: Never Used  . Alcohol Use: 0.5 oz/week    1 drink(s) per week     Comment: occ beer  . Drug Use: No  . Sexual Activity: Yes    Partners: Male    Birth Control/ Protection: Post-menopausal   Other Topics Concern  . Not on file   Social History Narrative  . No narrative on file    Past Surgical Hx:  Past Surgical History  Procedure Laterality Date  . Foot surgery  2013    left foot plantar fascitis reconstuction of great toe    Past Medical Hx:  Past Medical History  Diagnosis Date  . Abnormal pap 01/2002    cervicitis  . Osteoporosis     never has taken medication tx.   . Hypothyroid   . Migraine with aura     on inderal and prozac    Past Gynecological History:  Hx of multiple abnormal paps and cryo of the cervix >15 years ago.  Patient's last menstrual period was 10/12/2006.  Family Hx:  Family History  Problem Relation Age of Onset  . Cancer Father     skin  . Diabetes Maternal Uncle   . Cancer - Colon Maternal Grandfather     colon    Review of Systems:  Constitutional  Feels well,    ENT Normal appearing ears and nares bilaterally Skin/Breast  No rash, sores, jaundice, itching, dryness Cardiovascular  No chest pain, shortness of breath, or edema  Pulmonary  No cough or wheeze.  Gastro Intestinal  No nausea, vomitting, or diarrhoea. No bright red blood per rectum, no abdominal pain, change in bowel movement, or constipation.  Genito Urinary  No frequency, urgency, dysuria, see HPI Musculo Skeletal  No myalgia, arthralgia, joint swelling or pain  Neurologic  No weakness, numbness, change in gait,  Psychology  No depression, anxiety, insomnia.   Vitals:  Blood pressure 131/64, pulse 68, temperature 98.5 F (36.9 C), temperature source Oral, resp. rate 18, height 5\' 3"  (1.6 m), weight 179 lb 9.6 oz  (81.466 kg), last menstrual period 10/12/2006.  Physical Exam: WD in NAD Neck  Supple NROM, without any enlargements.  Lymph Node Survey No cervical supraclavicular or inguinal adenopathy Cardiovascular  Pulse normal rate, regularity and rhythm. S1 and S2 normal.  Lungs  Clear to auscultation bilateraly, without wheezes/crackles/rhonchi. Good air movement.  Skin  No rash/lesions/breakdown  Psychiatry  Alert and oriented to person, place, and time  Abdomen  Normoactive bowel sounds, abdomen soft, non-tender and obese without evidence of hernia.  Back No CVA tenderness Genito Urinary  Vulva/vagina: Normal external female genitalia.The excisional biopsy site is visualized with no residual acetowhite areas or leukoplakia visible. It is healing well.4% acetic acid was applied to  the labia and a 2-3cm area of nonraised acetowhite tissue was identified at the right vaginal introitus. Vulvar biopsy of this site was performed by infiltrating the area with 1cc of 1% lidocaine, followed by taking a 2mm punch biopsy. The lesion was sent for permanent pathology and the site was made hemostatic with a silver nitrate stick.  Bladder/urethra:  No lesions or masses, well supported bladder  Vagina: no lesions  Cervix: Normal appearing, no lesions.  Uterus:  Small, mobile, no parametrial involvement or nodularity.  Adnexa: no palpable masses. Rectal  Good tone, no masses no cul de sac nodularity.  Extremities  No bilateral cyanosis, clubbing or edema.   Quinn Axe, MD  12/22/2013, 12:51 PM

## 2013-12-22 NOTE — Patient Instructions (Signed)
We will call you with the biopsy results. Follow up with your GYN

## 2013-12-29 ENCOUNTER — Telehealth: Payer: Self-pay | Admitting: *Deleted

## 2013-12-29 NOTE — Telephone Encounter (Signed)
Notes Recorded by Adolphus BirchwoodEmma Rossi, MD on 12/26/2013 at 10:16 PM Corrie DandyMary, Would you mind letting Renea Eevelyn know that her biopsy showed more of the same (VIN1). And it does not change our recommendations (close followup, no need for additional excisional procedures at this time). Thanks  ECR      Per MD, notified pt biopsy showed more of the same (VIN1). And it does not change our recommendations ( close follow up, no need for additional excisional procedures at this time) Requested pt call our office for any concerns. Pt will continue to follow up with her GYN.

## 2013-12-29 NOTE — Telephone Encounter (Signed)
Duplicate entry

## 2014-03-14 ENCOUNTER — Encounter: Payer: Self-pay | Admitting: Gynecologic Oncology

## 2014-08-17 ENCOUNTER — Ambulatory Visit (INDEPENDENT_AMBULATORY_CARE_PROVIDER_SITE_OTHER): Payer: 59 | Admitting: Nurse Practitioner

## 2014-08-17 ENCOUNTER — Encounter: Payer: Self-pay | Admitting: Nurse Practitioner

## 2014-08-17 VITALS — BP 124/82 | HR 76 | Ht 63.25 in | Wt 184.0 lb

## 2014-08-17 DIAGNOSIS — Z0000 Encounter for general adult medical examination without abnormal findings: Secondary | ICD-10-CM

## 2014-08-17 DIAGNOSIS — IMO0002 Reserved for concepts with insufficient information to code with codable children: Secondary | ICD-10-CM

## 2014-08-17 DIAGNOSIS — B977 Papillomavirus as the cause of diseases classified elsewhere: Secondary | ICD-10-CM

## 2014-08-17 DIAGNOSIS — R896 Abnormal cytological findings in specimens from other organs, systems and tissues: Secondary | ICD-10-CM | POA: Diagnosis not present

## 2014-08-17 DIAGNOSIS — Z01419 Encounter for gynecological examination (general) (routine) without abnormal findings: Secondary | ICD-10-CM

## 2014-08-17 LAB — POCT URINALYSIS DIPSTICK
Bilirubin, UA: NEGATIVE
Blood, UA: NEGATIVE
Glucose, UA: NEGATIVE
Ketones, UA: NEGATIVE
Leukocytes, UA: NEGATIVE
Nitrite, UA: NEGATIVE
Protein, UA: NEGATIVE
Urobilinogen, UA: NEGATIVE
pH, UA: 5

## 2014-08-17 NOTE — Progress Notes (Signed)
Patient ID: Deborah Richards, female   DOB: 04/13/1956, 59 y.o.   MRN: [id number] 59 y.o. G1P0001 Married  Caucasian Fe here for annual exam.  Occasional flashes but in general feels warm.  She wants to see a dermatologist about multiple skin lesions on both legs - some past discussion with Dr.Lathrop about these lesions being the same type of lesion that may be causing vulvar lesions?.  Patient's last menstrual period was 10/12/2006.          Sexually active: No.  The current method of family planning is none and post menopausal status.    Exercising: No.  The patient does not participate in regular exercise at present. Smoker:  former  Health Maintenance: Pap:  07/29/11, negative with neg HR HPV MMG:  07/05/13, Bi-Rads 1:  Negative Colonoscopy:  2010, repeat in 10 years BMD:   07/05/13 T Score: spine -2.2; left hip neck -2.0 TDaP:  UTD with Dr. Katrinka BlazingSmith Labs:  HB:  PCP  Urine:  Negative    reports that she quit smoking about 5 years ago. Her smoking use included Cigarettes. She has a 15 pack-year smoking history. She has never used smokeless tobacco. She reports that she drinks about 1.2 oz of alcohol per week. She reports that she does not use illicit drugs.  Past Medical History  Diagnosis Date  . Abnormal pap 01/2002    cervicitis  . Osteoporosis     never has taken medication tx.   . Hypothyroid   . Migraine with aura     on inderal and prozac  . VIN I (vulvar intraepithelial neoplasia I) 12/07/13    Past Surgical History  Procedure Laterality Date  . Foot surgery  2013    left foot plantar fascitis reconstuction of great toe  . Dilation and curettage of uterus      Current Outpatient Prescriptions  Medication Sig Dispense Refill  . Calcium 1200-1000 MG-UNIT CHEW Chew by mouth.    . Cholecalciferol (VITAMIN D-3) 1000 UNITS CAPS Take by mouth.    Marland Kitchen. FLUoxetine (PROZAC) 10 MG capsule Take 10 mg by mouth daily.    Marland Kitchen. levothyroxine (SYNTHROID, LEVOTHROID) 75 MCG tablet Take 1 tablet by  mouth daily.    Marland Kitchen. lovastatin (MEVACOR) 40 MG tablet Take 40 mg by mouth at bedtime.    . Omega-3 Fatty Acids (FISH OIL PO) Take by mouth.    . propranolol ER (INDERAL LA) 60 MG 24 hr capsule Take 60 mg by mouth daily.     . SUMAtriptan (IMITREX) 100 MG tablet Take 1 tablet by mouth as needed.      No current facility-administered medications for this visit.    Family History  Problem Relation Age of Onset  . Cancer Father     skin  . Diabetes Maternal Uncle   . Cancer - Colon Maternal Grandfather     colon    ROS:  Pertinent items are noted in HPI.  Otherwise, a comprehensive ROS was negative.  Exam:   BP 124/82 mmHg  Pulse 76  Ht 5' 3.25" (1.607 m)  Wt 184 lb (83.462 kg)  BMI 32.32 kg/m2  LMP 10/12/2006 Height: 5' 3.25" (160.7 cm) Ht Readings from Last 3 Encounters:  08/17/14 5' 3.25" (1.607 m)  12/22/13 5\' 3"  (1.6 m)  12/15/13 5' 3.5" (1.613 m)    General appearance: alert, cooperative and appears stated age Head: Normocephalic, without obvious abnormality, atraumatic Neck: no adenopathy, supple, symmetrical, trachea midline and thyroid normal to inspection and  palpation Lungs: clear to auscultation bilaterally Breasts: normal appearance, no masses or tenderness Heart: regular rate and rhythm Abdomen: soft, non-tender; no masses,  no organomegaly Extremities: extremities normal, atraumatic, no cyanosis or edema Skin: Skin color, texture, turgor normal. multiple small lesions on both legs that are scaly. Lymph nodes: Cervical, supraclavicular, and axillary nodes normal. No abnormal inguinal nodes palpated Neurologic: Grossly normal   Pelvic: External genitalia:  no lesions              Urethra:  normal appearing urethra with no masses, tenderness or lesions              Bartholin's and Skene's: normal                 Vagina: normal appearing vagina with normal color and discharge, no lesions              Cervix: anteverted              Pap taken: Yes.   Bimanual  Exam:  Uterus:  normal size, contour, position, consistency, mobility, non-tender              Adnexa: no mass, fullness, tenderness               Rectovaginal: Confirms               Anus:  normal sphincter tone, no lesions  Chaperone present: no  A:  Well Woman with normal exam  postmenopausal no HRT  History of VAIN I at vulvar with + margins and biopsy with LGSIL at the introitus lesion -needs colpo every 6 months and to be followed here  Skin lesions ? etiology  P:   Reviewed health and wellness pertinent to exam  Pap smear taken today  mammogram counseled on breast self exam, mammography screening, adequate intake of calcium and vitamin D, diet and exercise return annually or prn  An After Visit Summary was printed and given to the patient.

## 2014-08-19 LAB — IPS PAP TEST WITH HPV

## 2014-08-21 NOTE — Progress Notes (Signed)
Encounter reviewed by Dr. Seira Cody Silva.  

## 2014-12-02 ENCOUNTER — Telehealth: Payer: Self-pay | Admitting: *Deleted

## 2014-12-02 DIAGNOSIS — N900 Mild vulvar dysplasia: Secondary | ICD-10-CM

## 2014-12-02 NOTE — Telephone Encounter (Signed)
Call to patient on all three listed phone numbers. No details left. Very generic message to return call to Kennon Rounds at doctors office regarding follow-up appointment. Patient past due for colposcopy and no appointment scheduled.

## 2014-12-13 NOTE — Telephone Encounter (Signed)
Call to patient regarding colposcopy. Patient states she has been waiting for insurance department to call to schedule. Apologized for miscommunication but will proceed with scheduling now and follow-up with billing office for insurance precert. Patient is driving and unsure of schedule, states is five minutes from home and will call right back when she can check calendar.  She pulled over to supply new insurance information and this information is relayed to business office. She will call back when she knows what day she can come in.

## 2014-12-13 NOTE — Telephone Encounter (Signed)
Patient returns call. Agreeable to proceed with scheduling. Vulvar colpo scheduled for 12-15-14 at 4pm (based on patient's scheduling time preferences) with Dr Oscar La.   Routing to provider for final review. Patient agreeable to disposition. Will close encounter.   CC: Dr Oscar La

## 2014-12-15 ENCOUNTER — Encounter: Payer: Self-pay | Admitting: Obstetrics and Gynecology

## 2014-12-15 ENCOUNTER — Ambulatory Visit (INDEPENDENT_AMBULATORY_CARE_PROVIDER_SITE_OTHER): Payer: 59 | Admitting: Obstetrics and Gynecology

## 2014-12-15 DIAGNOSIS — N900 Mild vulvar dysplasia: Secondary | ICD-10-CM

## 2014-12-15 NOTE — Patient Instructions (Signed)
Continue to do monthly vulvar skin care checks  F/U vulvar colposcopy in 6 months

## 2014-12-15 NOTE — Progress Notes (Signed)
Patient ID: Deborah Richards, female   DOB: Feb 10, 1956, 59 y.o.   MRN: [id number] GYNECOLOGY  VISIT   HPI: 59 y.o.   Married  Caucasian  female   G1P0001 with Patient's last menstrual period was 10/12/2006.   here for  A vulvar colposcopy and possible vulvar biopsy. The patient was noted to have vulvar skin changes and was diagnosed with VIN 1 in 5/15. + margins on biopsy. She saw Dr Andrey Farmer in 8/15, she did another biopsy cw VIN 1. Recommended f/u vulvar colposcopy until the abnormality resolved. The patient had a recent normal annual exam. She had a negative pap with negative hpv in 4/16.  GYNECOLOGIC HISTORY: Patient's last menstrual period was 10/12/2006. Contraception: Post menopause Menopausal hormone therapy: None2 Last mammogram: 07-05-13 WNL Last pap smear: 08-17-14 WNL NEG HR HPV- colpo of VIN lesions every 6 mos.         OB History    Gravida Para Term Preterm AB TAB SAB Ectopic Multiple Living   1 1 0 0 0 0 0 0 0 1          Patient Active Problem List   Diagnosis Date Noted  . Mild vulvar dysplasia 11/30/2013  . Acute upper respiratory infections of unspecified site 07/30/2012  . Menopausal state 10/12/2006  . History of osteopenia 03/27/2005    Past Medical History  Diagnosis Date  . Abnormal pap 01/2002    cervicitis  . Osteoporosis     never has taken medication tx.   . Hypothyroid   . Migraine with aura     on inderal and prozac  . VIN I (vulvar intraepithelial neoplasia I) 12/07/13    Past Surgical History  Procedure Laterality Date  . Foot surgery  2013    left foot plantar fascitis reconstuction of great toe  . Dilation and curettage of uterus      Current Outpatient Prescriptions  Medication Sig Dispense Refill  . Calcium 1200-1000 MG-UNIT CHEW Chew by mouth.    . Cholecalciferol (VITAMIN D-3) 1000 UNITS CAPS Take by mouth.    Marland Kitchen FLUoxetine (PROZAC) 10 MG capsule Take 10 mg by mouth daily.    Marland Kitchen levothyroxine (SYNTHROID, LEVOTHROID) 88 MCG tablet TAKE 1 TABLET  BY MOUTH EVERY MORNING ON EMPTY STOMACH ONCE DAILY  1  . lovastatin (MEVACOR) 40 MG tablet Take 40 mg by mouth at bedtime.    . Omega-3 Fatty Acids (FISH OIL PO) Take by mouth.    . propranolol ER (INDERAL LA) 60 MG 24 hr capsule Take 60 mg by mouth daily.     . SUMAtriptan (IMITREX) 100 MG tablet Take 1 tablet by mouth as needed.      No current facility-administered medications for this visit.     ALLERGIES: Other  Family History  Problem Relation Age of Onset  . Cancer Father     skin  . Diabetes Maternal Uncle   . Cancer - Colon Maternal Grandfather     colon    History   Social History  . Marital Status: Married    Spouse Name: Amorie Rentz  . Number of Children: 1  . 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 Smoking status: Former Smoker -- 0.50 packs/day for 30 years    Types: Cigarettes    Quit date: 09/10/2008  . Smokeless tobacco: Never Used  . Alcohol Use: 1.2 oz/week    2 Standard drinks or equivalent  per week     Comment: occ beer  . Drug Use: No  . Sexual Activity: No   Other Topics Concern  . Not on file   Social History Narrative    ROS:  Pertinent items are noted in HPI.  PHYSICAL EXAMINATION:    BP 102/60 mmHg  Pulse 84  Resp 16  Wt 187 lb (84.823 kg)  LMP 10/12/2006    General appearance: alert, cooperative and appears stated age  Pelvic: External genitalia:  no lesions, her right labia majora is more prominent than her left, she states it has always been like this.               Urethra:  normal appearing urethra with no masses, tenderness or lesions              Bartholins and Skenes: normal     Vulvar colposcopy was performed. No vulvar abnormalities were noted prior to or with application of acetic acid.                 Chaperone was present for exam.  ASSESSMENT   H/O VIN 1, negative vulvar colposcopy today  PLAN  Recommend one more vulvar colposcopy in 6  months, if still negative, will only further evaluate if she has visible skin changes Recommended she do monthly vulvar skin checks   An After Visit Summary was printed and given to the patient.

## 2015-02-21 ENCOUNTER — Other Ambulatory Visit: Payer: Self-pay

## 2015-02-21 DIAGNOSIS — Z1231 Encounter for screening mammogram for malignant neoplasm of breast: Secondary | ICD-10-CM

## 2015-03-08 ENCOUNTER — Ambulatory Visit
Admission: RE | Admit: 2015-03-08 | Discharge: 2015-03-08 | Disposition: A | Discharge status: Home / Self Care | Payer: 59 | Source: Ambulatory Visit

## 2015-03-08 DIAGNOSIS — Z1231 Encounter for screening mammogram for malignant neoplasm of breast: Secondary | ICD-10-CM

## 2015-06-14 ENCOUNTER — Telehealth: Payer: Self-pay | Admitting: Obstetrics and Gynecology

## 2015-06-14 NOTE — Telephone Encounter (Signed)
Called patient to discuss benefits for a procedure. Left Voicemail requesting a call back. °

## 2015-07-31 ENCOUNTER — Telehealth: Payer: Self-pay | Admitting: *Deleted

## 2015-07-31 NOTE — Telephone Encounter (Signed)
Patient due for 6 month colpo. Call to patient on home and cell number. Left message to call back.

## 2015-08-02 NOTE — Telephone Encounter (Signed)
Call to patient, left message to call back. 

## 2015-08-02 NOTE — Telephone Encounter (Signed)
Return call from patient. Advised due for 6 month colpo.  Appointment scheduled for 08-07-15 with Dr Oscar LaJertson. Advised can discuss timing of annual exam and [facility] and [facility].  Last annual 08-17-14 and is scheduled for 08-28-15 with Shirlyn GoltzPatty Grubb, FNP. Instructed to take Motrin 800 mg one hour to procedure with food.  Routing to provider for final review. Patient agreeable to disposition. Will close encounter.

## 2015-08-04 ENCOUNTER — Telehealth: Payer: Self-pay | Admitting: Obstetrics and Gynecology

## 2015-08-04 NOTE — Telephone Encounter (Signed)
Spoke with patient. Reviewed benefit for procedure. Patient understood and agreeable. Verified arrival date/time for procedure. Reviewed cancellation policy with $100 fee. Patient agreeable. Ok to close.

## 2015-08-07 ENCOUNTER — Ambulatory Visit (INDEPENDENT_AMBULATORY_CARE_PROVIDER_SITE_OTHER): Payer: 59 | Admitting: Obstetrics and Gynecology

## 2015-08-07 ENCOUNTER — Encounter: Payer: Self-pay | Admitting: Obstetrics and Gynecology

## 2015-08-07 DIAGNOSIS — N900 Mild vulvar dysplasia: Secondary | ICD-10-CM | POA: Diagnosis not present

## 2015-08-07 NOTE — Progress Notes (Signed)
Patient ID: Deborah Richards, female   DOB: 08-19-1955, 60 y.o.   MRN: [id number] GYNECOLOGY  VISIT   HPI: 60 y.o.   Married  Caucasian  female   G1P0001 with Patient's last menstrual period was 10/12/2006.   here for a vulvar colposcopy. She has a h/o VIN 1 from biopsies done in 5 and 8/15. She is here for a vulvar colposcopy. She denies vulvar lesions or irritation, although she wasn't aware of any changes at the time of her VIN diagnosis.   GYNECOLOGIC HISTORY: Patient's last menstrual period was 10/12/2006. Contraception:postmenopause Menopausal hormone therapy: none         OB History    Gravida Para Term Preterm AB TAB SAB Ectopic Multiple Living           Patient Active Problem List   Diagnosis Date Noted  . Mild vulvar dysplasia 11/30/2013  . Acute upper respiratory infections of unspecified site 07/30/2012  . Menopausal state 10/12/2006  . History of osteopenia 03/27/2005    Past Medical History  Diagnosis Date  . Abnormal pap 01/2002    cervicitis  . Osteoporosis     never has taken medication tx.   . Hypothyroid   . Migraine with aura     on inderal and prozac  . VIN I (vulvar intraepithelial neoplasia I) 12/07/13    Past Surgical History  Procedure Laterality Date  . Foot surgery  2013    left foot plantar fascitis reconstuction of great toe  . Dilation and curettage of uterus      Current Outpatient Prescriptions  Medication Sig Dispense Refill  . Calcium 1200-1000 MG-UNIT CHEW Chew by mouth.    . Cholecalciferol (VITAMIN D-3) 1000 UNITS CAPS Take by mouth.    Marland Kitchen FLUoxetine (PROZAC) 10 MG capsule Take 10 mg by mouth daily.    Marland Kitchen levothyroxine (SYNTHROID, LEVOTHROID) 88 MCG tablet TAKE 1 TABLET BY MOUTH EVERY MORNING ON EMPTY STOMACH ONCE DAILY  1  . lovastatin (MEVACOR) 40 MG tablet Take 40 mg by mouth at bedtime.    . Omega-3 Fatty Acids (FISH OIL PO) Take by mouth.    . propranolol ER (INDERAL LA) 60 MG 24 hr capsule Take 60 mg by mouth  daily.     . SUMAtriptan (IMITREX) 100 MG tablet Take 1 tablet by mouth as needed.      No current facility-administered medications for this visit.     ALLERGIES: Other  Family History  Problem Relation Age of Onset  . Cancer Father     skin  . Diabetes Maternal Uncle   . Cancer - Colon Maternal Grandfather     colon    Social History   Social History  . Marital Status: Married    Spouse Name: Natia Fahmy  . Number of Children: 1  . 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 Smoking status: Former Smoker -- 0.50 packs/day for 30 years    Types: Cigarettes    Quit date: 09/10/2008  . Smokeless tobacco: Never Used  . Alcohol Use: 1.2 oz/week    2 Standard drinks or equivalent per week     Comment: occ beer  . Drug Use: No  . Sexual Activity: No   Other Topics Concern  . Not on file   Social History Narrative    Review of Systems  Constitutional: Negative.  HENT: Negative.   Eyes: Negative.   Respiratory: Negative.   Cardiovascular: Negative.   Gastrointestinal: Negative.   Genitourinary: Negative.   Musculoskeletal: Negative.   Skin: Negative.   Neurological: Negative.   Endo/Heme/Allergies: Negative.   Psychiatric/Behavioral: Negative.     PHYSICAL EXAMINATION:    BP 100/60 mmHg  Pulse 64  Resp 16  Wt 187 lb (84.823 kg)  LMP 10/12/2006    General appearance: alert, cooperative and appears stated age  Pelvic: External genitalia:  no lesions              Urethra:  normal appearing urethra with no masses, tenderness or lesions              Bartholins and Skenes: normal                   Vulvar colposcopy was done. Mild aceto-white changes were noted on the inner right labia majora, just lateral to the labia minora. Same area as prior diagrams depict as the area of VIN. Suspect possible scar tissue.   The risks of the procedure were reviewed with the patient and a consent was signed.  The area was cleansed with betadine and injected with 1% lidocaine. A 3 mm punch biopsy was used to remove a circular piece of tissue. The defect was closed with 4-0 vicryl. The patient tolerated the procedure well.     Chaperone was present for exam.  ASSESSMENT H/O VIN 1 in 2015. Vulvar colposcopy and biopsy done today. Suspect area biopsied is scar tissue and not VIN    PLAN Await biopsy results, if normal she can resume yearly checkups  Plan pap with hpv next month at her annual exam with Mrs. Berneice GandyGrubb She declines vulvar self exams   An After Visit Summary was printed and given to the patient.

## 2015-08-29 ENCOUNTER — Ambulatory Visit (INDEPENDENT_AMBULATORY_CARE_PROVIDER_SITE_OTHER): Payer: 59 | Admitting: Nurse Practitioner

## 2015-08-29 ENCOUNTER — Encounter: Payer: Self-pay | Admitting: Nurse Practitioner

## 2015-08-29 VITALS — BP 124/82 | HR 68 | Ht 63.25 in | Wt 187.0 lb

## 2015-08-29 DIAGNOSIS — Z0000 Encounter for general adult medical examination without abnormal findings: Secondary | ICD-10-CM

## 2015-08-29 DIAGNOSIS — N900 Mild vulvar dysplasia: Secondary | ICD-10-CM | POA: Diagnosis not present

## 2015-08-29 DIAGNOSIS — Z01419 Encounter for gynecological examination (general) (routine) without abnormal findings: Secondary | ICD-10-CM

## 2015-08-29 LAB — POCT URINALYSIS DIPSTICK
Bilirubin, UA: NEGATIVE
Blood, UA: NEGATIVE
Glucose, UA: NEGATIVE
Ketones, UA: NEGATIVE
Leukocytes, UA: NEGATIVE
Nitrite, UA: NEGATIVE
Protein, UA: NEGATIVE
Urobilinogen, UA: NEGATIVE
pH, UA: 5

## 2015-08-29 NOTE — Patient Instructions (Signed)

## 2015-08-29 NOTE — Progress Notes (Signed)
Patient ID: Deborah Richards, female   DOB: April 21, 1956, 60 y.o.   MRN: [id number]  60 y.o. G1P0001 Married  Caucasian Fe here for annual exam.  She has seen dermatologist and was diagnosed with keratosis on inner legs.  He has advised treatment with OTC CeraVe' for rough & dry skin. Last vulvar colpo was done 08/07/15 and was negative.  If this pap is normal then back to yearly pap unless skin vulvar changes. Daughter has moved to [place] with her husbands job in the [employer].  They have a set of triplets age 65 and a younger daughter age 64.  Patient's last menstrual period was 10/12/2006 (approximate).          Sexually active: No.  The current method of family planning is post menopausal status.    Exercising: No.  The patient does not participate in regular exercise at present. Smoker:  no  Health Maintenance: Pap: 08/17/14, Negative with neg HR HPV MMG: 03/08/15, Bi-Rads 1:  Negative Colonoscopy: 04/25/08, hyperplastic polyp x 5, repeat in 10 years BMD:  07/05/13 T Score: spine -2.2; left femur neck: -2.0 ( FRAX: major 7.5%/ hip 8%) TDaP: 2008 Shingles: Not indicated due to age Pneumonia: Not indicated due to age Hep C and HIV: drawn today Labs: PCP takes care of labs  Urine: negative   reports that she quit smoking about 6 years ago. Her smoking use included Cigarettes. She has a 15 pack-year smoking history. She has never used smokeless tobacco. She reports that she drinks about 1.2 oz of alcohol per week. She reports that she does not use illicit drugs.  Past Medical History  Diagnosis Date  . Abnormal pap 01/2002    cervicitis  . Osteoporosis     never has taken medication tx.   . Hypothyroid   . Migraine with aura     on inderal and prozac  . VIN I (vulvar intraepithelial neoplasia I) 12/07/13    Past Surgical History  Procedure Laterality Date  . Foot surgery  2013    left foot plantar fascitis reconstuction of great toe  . Dilation and curettage of uterus      Current  Outpatient Prescriptions  Medication Sig Dispense Refill  . Calcium 1200-1000 MG-UNIT CHEW Chew by mouth.    . Cholecalciferol (VITAMIN D-3) 1000 UNITS CAPS Take by mouth.    Marland Kitchen FLUoxetine (PROZAC) 10 MG capsule Take 10 mg by mouth daily.    Marland Kitchen levothyroxine (SYNTHROID, LEVOTHROID) 88 MCG tablet TAKE 1 TABLET BY MOUTH EVERY MORNING ON EMPTY STOMACH ONCE DAILY  1  . lovastatin (MEVACOR) 40 MG tablet Take 40 mg by mouth at bedtime.    . Omega-3 Fatty Acids (FISH OIL PO) Take by mouth.    . propranolol (INDERAL) 20 MG tablet Take 20 mg by mouth daily.    . SUMAtriptan (IMITREX) 100 MG tablet Take 1 tablet by mouth as needed.      No current facility-administered medications for this visit.    Family History  Problem Relation Age of Onset  . Cancer Father     skin  . Diabetes Maternal Uncle   . Cancer - Colon Maternal Grandfather     colon    ROS:  Pertinent items are noted in HPI.  Otherwise, a comprehensive ROS was negative.  Exam:   BP 124/82 mmHg  Pulse 68  Ht 5' 3.25" (1.607 m)  Wt 187 lb (84.823 kg)  BMI 32.85 kg/m2  LMP 10/12/2006 (Approximate) Height: 5' 3.25" (160.7  cm) Ht Readings from Last 3 Encounters:  08/29/15 5' 3.25" (1.607 m)  08/17/14 5' 3.25" (1.607 m)  12/22/13 5\' 3"  (1.6 m)    General appearance: alert, cooperative and appears stated age Head: Normocephalic, without obvious abnormality, atraumatic Neck: no adenopathy, supple, symmetrical, trachea midline and thyroid normal to inspection and palpation Lungs: clear to auscultation bilaterally Breasts: normal appearance, no masses or tenderness Heart: regular rate and rhythm Abdomen: soft, non-tender; no masses,  no organomegaly Extremities: extremities normal, atraumatic, no cyanosis or edema Skin: Skin color, texture, turgor normal. No rashes or lesions Lymph nodes: Cervical, supraclavicular, and axillary nodes normal. No abnormal inguinal nodes palpated Neurologic: Grossly normal   Pelvic: External  genitalia:  no lesions              Urethra:  normal appearing urethra with no masses, tenderness or lesions              Bartholin's and Skene's: normal                 Vagina: normal appearing vagina with normal color and discharge, no lesions              Cervix: anteverted              Pap taken: Yes.   Bimanual Exam:  Uterus:  normal size, contour, position, consistency, mobility, non-tender              Adnexa: no mass, fullness, tenderness               Rectovaginal: Confirms               Anus:  normal sphincter tone, no lesions  Chaperone present: yes  A:  Well Woman with normal exam  Postmenopausal no HRT History of VAIN I at vulvar with + margins and biopsy with LGSIL at the introitus lesion by Dr Andrey Farmerossi 12/22/13 -needs colpo every 6 months and to be followed here - colpo done 12/15/14 & 08/07/15 negative. History of migraine HA's   P:   Reviewed health and wellness pertinent to exam  Pap smear as above  Mammogram is due 02/2016  Will follow with labs  Counseled on breast self exam, mammography screening, adequate intake of calcium and vitamin D, diet and exercise return annually or prn  An After Visit Summary was printed and given to the patient.

## 2015-08-30 LAB — HEPATITIS C ANTIBODY: HCV Ab: NEGATIVE

## 2015-08-30 LAB — HIV ANTIBODY (ROUTINE TESTING W REFLEX): HIV 1&2 Ab, 4th Generation: NONREACTIVE

## 2015-08-30 NOTE — Progress Notes (Signed)
Reviewed personally.  M. Suzanne Abdurahman Rugg, MD.  

## 2015-08-31 LAB — IPS PAP TEST WITH HPV

## 2015-12-04 DIAGNOSIS — E7800 Pure hypercholesterolemia, unspecified: Secondary | ICD-10-CM | POA: Insufficient documentation

## 2015-12-04 DIAGNOSIS — G43909 Migraine, unspecified, not intractable, without status migrainosus: Secondary | ICD-10-CM | POA: Insufficient documentation

## 2015-12-04 DIAGNOSIS — E039 Hypothyroidism, unspecified: Secondary | ICD-10-CM | POA: Insufficient documentation

## 2016-09-03 NOTE — Progress Notes (Signed)
61 y.o. G1P0001 Married  Caucasian Fe here for annual exam. She denies any symptoms of pain or discomfort from previous vulvar biopsy site.  Left lower leg fracture late July in [place].  Treated with casting for 8 weeks.  Now back to normal.  Father age 62 passed 06/22/16 with pulmonary fibrosis.  Mother is doing OK.  Patient's last menstrual period was 10/12/2006 (approximate).          Sexually active: No.  The current method of family planning is none.    Exercising: No.  The patient does not participate in regular exercise at present. Smoker:  no  Health Maintenance: Pap: 08/29/15, Negative with neg HR HPV  08/17/14, Negative with neg HR HPV History of Abnormal Pap: yes MMG: 03/08/15, Bi-Rads 1:  Negative Self Breast exams: yes Colonoscopy: 04/25/08, hyperplastic polyp x 5, repeat in 10 years BMD: 07/05/13 TScore: -2.2 Spine / -2.0 Left Femur Neck TDaP:  2008 - will discuss with PCP Hep C and HIV: 08/29/15 Labs: PCP takes care of all labs - getting done tomorrow - will make sure Vit D is done   reports that she quit smoking about 7 years ago. Her smoking use included Cigarettes. She has a 15.00 pack-year smoking history. She has never used smokeless tobacco. She reports that she drinks about 0.6 oz of alcohol per week . She reports that she does not use drugs.  Past Medical History:  Diagnosis Date  . Abnormal pap 01/2002   cervicitis  . Hypothyroid   . Migraine with aura    on inderal and prozac  . Osteoporosis    never has taken medication tx.   Marland Kitchen VIN I (vulvar intraepithelial neoplasia I) 12/07/13    Past Surgical History:  Procedure Laterality Date  . DILATION AND CURETTAGE OF UTERUS    . FOOT SURGERY  2013   left foot plantar fascitis reconstuction of great toe    Current Outpatient Prescriptions  Medication Sig Dispense Refill  . Calcium 1200-1000 MG-UNIT CHEW Chew by mouth.    . Cholecalciferol (VITAMIN D-3) 1000 UNITS CAPS Take by mouth.    Marland Kitchen FLUoxetine  (PROZAC) 10 MG capsule Take 10 mg by mouth daily.    Marland Kitchen levothyroxine (SYNTHROID, LEVOTHROID) 88 MCG tablet TAKE 1 TABLET BY MOUTH EVERY MORNING ON EMPTY STOMACH ONCE DAILY  1  . lovastatin (MEVACOR) 40 MG tablet Take 40 mg by mouth at bedtime.    . Omega-3 Fatty Acids (FISH OIL PO) Take by mouth.    . propranolol (INDERAL) 20 MG tablet Take 20 mg by mouth daily.    . SUMAtriptan (IMITREX) 100 MG tablet Take 1 tablet by mouth as needed.      No current facility-administered medications for this visit.     Family History  Problem Relation Age of Onset  . Cancer Father     skin  . Pulmonary fibrosis Father   . Other Sister 59    Pre-diabetes (2018)  . Diabetes Maternal Uncle   . Cancer - Colon Maternal Grandfather     colon    ROS:  Pertinent items are noted in HPI.  Otherwise, a comprehensive ROS was negative.  Exam:   BP 114/70 (BP Location: Right Arm, Patient Position: Sitting, Cuff Size: Large)   Pulse 60   Ht 5' 3.75" (1.619 m)   Wt 192 lb (87.1 kg)   LMP 10/12/2006 (Approximate)   BMI 33.22 kg/m  Height: 5' 3.75" (161.9 cm) Ht Readings from Last 3 Encounters:  09/04/16 5' 3.75" (1.619 m)  08/29/15 5' 3.25" (1.607 m)  08/17/14 5' 3.25" (1.607 m)    General appearance: alert, cooperative and appears stated age Head: Normocephalic, without obvious abnormality, atraumatic Neck: no adenopathy, supple, symmetrical, trachea midline and thyroid normal to inspection and palpation Lungs: clear to auscultation bilaterally Breasts: normal appearance, no masses or tenderness Heart: regular rate and rhythm Abdomen: soft, non-tender; no masses,  no organomegaly Extremities: extremities normal, atraumatic, no cyanosis or edema Skin: Skin color, texture, turgor normal. No rashes or lesions Lymph nodes: Cervical, supraclavicular, and axillary nodes normal. No abnormal inguinal nodes palpated Neurologic: Grossly normal   Pelvic: External genitalia:  no lesions, no problems at  vulva              Urethra:  normal appearing urethra with no masses, tenderness or lesions              Bartholin's and Skene's: normal                 Vagina: normal appearing vagina with normal color and discharge, no lesions              Cervix: anteverted              Pap taken: No. Bimanual Exam:  Uterus:  normal size, contour, position, consistency, mobility, non-tender              Adnexa: no mass, fullness, tenderness               Rectovaginal: Confirms               Anus:  normal sphincter tone, no lesions  Chaperone present: yes  A:  Well Woman with normal exam  Postmenopausal no HRT History of VAIN I at vulvar with + margins and biopsy with LGSIL at the introitus lesion by Dr Andrey Farmer 12/22/13 -had every 6 months X 2 and colpo done 12/15/14 & 08/07/15 negative. History of migraine HA's  History of fractured left leg 11/2015, history of Osteopenia (borderline Osteoporosis)   P:   Reviewed health and wellness pertinent to exam  Pap smear: no  Mammogram is due and will get a repeat BMD at same time counseled on breast self exam, mammography screening, adequate intake of calcium and vitamin D, diet and exercise, Kegel's exercises return annually or prn  An After Visit Summary was printed and given to the patient.

## 2016-09-04 ENCOUNTER — Ambulatory Visit (INDEPENDENT_AMBULATORY_CARE_PROVIDER_SITE_OTHER): Payer: 59 | Admitting: Nurse Practitioner

## 2016-09-04 ENCOUNTER — Encounter: Payer: Self-pay | Admitting: Nurse Practitioner

## 2016-09-04 VITALS — BP 114/70 | HR 60 | Ht 63.75 in | Wt 192.0 lb

## 2016-09-04 DIAGNOSIS — M8588 Other specified disorders of bone density and structure, other site: Secondary | ICD-10-CM

## 2016-09-04 DIAGNOSIS — Z01411 Encounter for gynecological examination (general) (routine) with abnormal findings: Secondary | ICD-10-CM | POA: Diagnosis not present

## 2016-09-04 DIAGNOSIS — N900 Mild vulvar dysplasia: Secondary | ICD-10-CM | POA: Diagnosis not present

## 2016-09-04 DIAGNOSIS — E2839 Other primary ovarian failure: Secondary | ICD-10-CM

## 2016-09-04 NOTE — Patient Instructions (Addendum)

## 2016-09-05 NOTE — Progress Notes (Signed)
Encounter reviewed by Dr. Roshawn Lacina Amundson C. Silva.  

## 2017-02-18 ENCOUNTER — Other Ambulatory Visit: Payer: Self-pay | Admitting: Family Medicine

## 2017-02-18 DIAGNOSIS — Z1231 Encounter for screening mammogram for malignant neoplasm of breast: Secondary | ICD-10-CM

## 2017-02-20 ENCOUNTER — Ambulatory Visit
Admission: RE | Admit: 2017-02-20 | Discharge: 2017-02-20 | Disposition: A | Discharge status: Home / Self Care | Payer: 59 | Source: Ambulatory Visit | Attending: Family Medicine | Admitting: Family Medicine

## 2017-02-20 DIAGNOSIS — Z1231 Encounter for screening mammogram for malignant neoplasm of breast: Secondary | ICD-10-CM

## 2017-05-29 DIAGNOSIS — J209 Acute bronchitis, unspecified: Secondary | ICD-10-CM | POA: Diagnosis not present

## 2017-06-05 DIAGNOSIS — J209 Acute bronchitis, unspecified: Secondary | ICD-10-CM | POA: Diagnosis not present

## 2017-06-24 ENCOUNTER — Ambulatory Visit
Admission: RE | Admit: 2017-06-24 | Discharge: 2017-06-24 | Disposition: A | Discharge status: Home / Self Care | Payer: 59 | Source: Ambulatory Visit | Attending: Family Medicine | Admitting: Family Medicine

## 2017-06-24 ENCOUNTER — Other Ambulatory Visit: Payer: Self-pay | Admitting: Family Medicine

## 2017-06-24 DIAGNOSIS — R05 Cough: Secondary | ICD-10-CM

## 2017-06-24 DIAGNOSIS — R059 Cough, unspecified: Secondary | ICD-10-CM

## 2017-09-01 ENCOUNTER — Ambulatory Visit: Payer: 59 | Admitting: Obstetrics and Gynecology

## 2017-09-05 ENCOUNTER — Ambulatory Visit: Payer: 59 | Admitting: Nurse Practitioner

## 2017-12-23 NOTE — Progress Notes (Signed)
62 y.o. 351P0001 Married Caucasian female here for annual exam.  No vaginal bleeding. Not sexually active.  Just found out her Mother has dementia. Her Dad died last year.  No bowel or bladder issues.     Patient's last menstrual period was 10/12/2006 (approximate).          Sexually active: No.  The current method of family planning is post menopausal status.    Exercising: No.  n/a Smoker:  no  Health Maintenance: Pap:  08/29/2015 negative, negative HPV, 08/17/14: negative, negative hpv History of abnormal Pap:  No MMG:  02/20/2017 BI-RADS CATEGORY  1: Negative. Colonoscopy:  04/25/2008 history of polyp, repeat in 10 years BMD:   07/05/2013  Osteopenia at spine and left hip, with elevated risk of hip fracture of 8%. She fractured her fibula last year (fell of of bottom step).  TDaP:  05/13/2006 Gardasil: none   reports that she quit smoking about 9 years ago. Her smoking use included cigarettes. She has a 15.00 pack-year smoking history. She has never used smokeless tobacco. She reports that she drinks about 1.0 standard drinks of alcohol per week. She reports that she does not use drugs. 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 Daughter is [id number], lives in [place], 4 kids (triplets and a single).   Past Medical History:  Diagnosis Date  . Abnormal pap 01/2002   cervicitis  . Hypothyroid   . Migraine with aura    on inderal and prozac  . Osteoporosis    never has taken medication tx.   Marland Kitchen. VIN I (vulvar intraepithelial neoplasia I) 12/07/13    Past Surgical History:  Procedure Laterality Date  . DILATION AND CURETTAGE OF UTERUS    . FOOT SURGERY  2013   left foot plantar fascitis reconstuction of great toe    Current Outpatient Medications  Medication Sig Dispense Refill  . Calcium 1200-1000 MG-UNIT CHEW Chew by mouth.    . Cholecalciferol (VITAMIN D-3) 1000 UNITS CAPS Take by mouth.    Marland Kitchen. FLUoxetine (PROZAC) 10 MG capsule Take 10 mg by mouth daily.    Marland Kitchen. levothyroxine (SYNTHROID,  LEVOTHROID) 88 MCG tablet TAKE 1 TABLET BY MOUTH EVERY MORNING ON EMPTY STOMACH ONCE DAILY  1  . lovastatin (MEVACOR) 40 MG tablet Take 40 mg by mouth at bedtime.    . Omega-3 Fatty Acids (FISH OIL PO) Take by mouth.    . propranolol (INDERAL) 20 MG tablet Take 20 mg by mouth daily.    . SUMAtriptan (IMITREX) 100 MG tablet Take 1 tablet by mouth as needed.      No current facility-administered medications for this visit.     Family History  Problem Relation Age of Onset  . Cancer Father        skin  . Pulmonary fibrosis Father   . Other Sister [id number]       Pre-diabetes (2018)  . Diabetes Maternal Uncle   . Cancer - Colon Maternal Grandfather        colon    Review of Systems  Constitutional: Positive for unexpected weight change.  HENT: Negative.   Eyes: Negative.   Respiratory: Negative.   Cardiovascular: Negative.   Gastrointestinal: Negative.   Endocrine: Negative.   Genitourinary: Negative.   Musculoskeletal: Negative.   Skin: Negative.   Allergic/Immunologic: Negative.   Neurological: Negative.   Hematological: Negative.   Psychiatric/Behavioral: Negative.   All other systems reviewed and are negative.   Exam:   LMP 10/12/2006 (Approximate)  Weight change: @WEIGHTCHANGE @ Height:      Ht Readings from Last 3 Encounters:  09/04/16 5' 3.75" (1.619 m)  08/29/15 5' 3.25" (1.607 m)  08/17/14 5' 3.25" (1.607 m)    General appearance: alert, cooperative and appears stated age Head: Normocephalic, without obvious abnormality, atraumatic Neck: no adenopathy, supple, symmetrical, trachea midline and thyroid normal to inspection and palpation Lungs: clear to auscultation bilaterally Cardiovascular: regular rate and rhythm Breasts: normal appearance, no masses or tenderness Abdomen: soft, non-tender; non distended,  no masses,  no organomegaly Extremities: extremities normal, atraumatic, no cyanosis or edema Skin: Skin color, texture, turgor normal. No rashes or  lesions Lymph nodes: Cervical, supraclavicular, and axillary nodes normal. No abnormal inguinal nodes palpated Neurologic: Grossly normal   Pelvic: External genitalia:  no lesions              Urethra:  normal appearing urethra with no masses, tenderness or lesions              Bartholins and Skenes: normal                 Vagina: normal appearing vagina with normal color and discharge, no lesions              Cervix: no lesions               Bimanual Exam:  Uterus:  normal size, contour, position, consistency, mobility, non-tender              Adnexa: no mass, fullness, tenderness               Rectovaginal: Confirms               Anus:  normal sphincter tone, no lesions  Chaperone was present for exam.  A:  Well Woman with normal exam  Osteopenia with elevated risk of fracture, overdue of DEXA  H/O VIN in 2015, normal exam  P:   She will get her labs with her primary next weeks, will ask for a vit d and have a copy of her labs sent here.  Pap with hpv  Discussed breast self exam  Discussed calcium and vit D intake  DEXA overdue  Mammogram and colonoscopy later this year

## 2017-12-25 ENCOUNTER — Encounter: Payer: Self-pay | Admitting: Obstetrics and Gynecology

## 2017-12-25 ENCOUNTER — Other Ambulatory Visit: Payer: Self-pay

## 2017-12-25 ENCOUNTER — Other Ambulatory Visit (HOSPITAL_COMMUNITY)
Admission: RE | Admit: 2017-12-25 | Discharge: 2017-12-25 | Disposition: A | Discharge status: Home / Self Care | Payer: BLUE CROSS/BLUE SHIELD | Source: Ambulatory Visit | Attending: Obstetrics and Gynecology | Admitting: Obstetrics and Gynecology

## 2017-12-25 ENCOUNTER — Ambulatory Visit (INDEPENDENT_AMBULATORY_CARE_PROVIDER_SITE_OTHER): Payer: BLUE CROSS/BLUE SHIELD | Admitting: Obstetrics and Gynecology

## 2017-12-25 VITALS — BP 120/68 | HR 68 | Resp 16 | Ht 63.0 in | Wt 193.0 lb

## 2017-12-25 DIAGNOSIS — M8580 Other specified disorders of bone density and structure, unspecified site: Secondary | ICD-10-CM

## 2017-12-25 DIAGNOSIS — Z124 Encounter for screening for malignant neoplasm of cervix: Secondary | ICD-10-CM

## 2017-12-25 DIAGNOSIS — Z01419 Encounter for gynecological examination (general) (routine) without abnormal findings: Secondary | ICD-10-CM

## 2017-12-25 NOTE — Patient Instructions (Signed)
EXERCISE AND DIET:  We recommended that you start or continue a regular exercise program for good health. Regular exercise means any activity that makes your heart beat faster and makes you sweat.  We recommend exercising at least 30 minutes per day at least 3 days a week, preferably 4 or 5.  We also recommend a diet low in fat and sugar.  Inactivity, poor dietary choices and obesity can cause diabetes, heart attack, stroke, and kidney damage, among others.    ALCOHOL AND SMOKING:  Women should limit their alcohol intake to no more than 7 drinks/beers/glasses of wine (combined, not each!) per week. Moderation of alcohol intake to this level decreases your risk of breast cancer and liver damage. And of course, no recreational drugs are part of a healthy lifestyle.  And absolutely no smoking or even second hand smoke. Most people know smoking can cause heart and lung diseases, but did you know it also contributes to weakening of your bones? Aging of your skin?  Yellowing of your teeth and nails?  CALCIUM AND VITAMIN D:  Adequate intake of calcium and Vitamin D are recommended.  The recommendations for exact amounts of these supplements seem to change often, but generally speaking 1,200 mg of calcium (either carbonate or citrate) and 800 units of Vitamin D per day seems prudent. Certain women may benefit from higher intake of Vitamin D.  If you are among these women, your doctor will have told you during your visit.    PAP SMEARS:  Pap smears, to check for cervical cancer or precancers,  have traditionally been done yearly, although recent scientific advances have shown that most women can have pap smears less often.  However, every woman still should have a physical exam from her gynecologist every year. It will include a breast check, inspection of the vulva and vagina to check for abnormal growths or skin changes, a visual exam of the cervix, and then an exam to evaluate the size and shape of the uterus and  ovaries.  And after 60 years of age, a rectal exam is indicated to check for rectal cancers. We will also provide age appropriate advice regarding health maintenance, like when you should have certain vaccines, screening for sexually transmitted diseases, bone density testing, colonoscopy, mammograms, etc.   MAMMOGRAMS:  All women over 40 years old should have a yearly mammogram. Many facilities now offer a "3D" mammogram, which may cost around $50 extra out of pocket. If possible,  we recommend you accept the option to have the 3D mammogram performed.  It both reduces the number of women who will be called back for extra views which then turn out to be normal, and it is better than the routine mammogram at detecting truly abnormal areas.    COLONOSCOPY:  Colonoscopy to screen for colon cancer is recommended for all women at age 50.  We know, you hate the idea of the prep.  We agree, BUT, having colon cancer and not knowing it is worse!!  Colon cancer so often starts as a polyp that can be seen and removed at colonscopy, which can quite literally save your life!  And if your first colonoscopy is normal and you have no family history of colon cancer, most women don't have to have it again for 10 years.  Once every ten years, you can do something that may end up saving your life, right?  We will be happy to help you get it scheduled when you are ready.    Be sure to check your insurance coverage so you understand how much it will cost.  It may be covered as a preventative service at no cost, but you should check your particular policy.      Breast Self-Awareness Breast self-awareness means being familiar with how your breasts look and feel. It involves checking your breasts regularly and reporting any changes to your health care provider. Practicing breast self-awareness is important. A change in your breasts can be a sign of a serious medical problem. Being familiar with how your breasts look and feel allows  you to find any problems early, when treatment is more likely to be successful. All women should practice breast self-awareness, including women who have had breast implants. How to do a breast self-exam One way to learn what is normal for your breasts and whether your breasts are changing is to do a breast self-exam. To do a breast self-exam: Look for Changes  1. Remove all the clothing above your waist. 2. Stand in front of a mirror in a room with good lighting. 3. Put your hands on your hips. 4. Push your hands firmly downward. 5. Compare your breasts in the mirror. Look for differences between them (asymmetry), such as: ? Differences in shape. ? Differences in size. ? Puckers, dips, and bumps in one breast and not the other. 6. Look at each breast for changes in your skin, such as: ? Redness. ? Scaly areas. 7. Look for changes in your nipples, such as: ? Discharge. ? Bleeding. ? Dimpling. ? Redness. ? A change in position. Feel for Changes  Carefully feel your breasts for lumps and changes. It is best to do this while lying on your back on the floor and again while sitting or standing in the shower or tub with soapy water on your skin. Feel each breast in the following way:  Place the arm on the side of the breast you are examining above your head.  Feel your breast with the other hand.  Start in the nipple area and make  inch (2 cm) overlapping circles to feel your breast. Use the pads of your three middle fingers to do this. Apply light pressure, then medium pressure, then firm pressure. The light pressure will allow you to feel the tissue closest to the skin. The medium pressure will allow you to feel the tissue that is a little deeper. The firm pressure will allow you to feel the tissue close to the ribs.  Continue the overlapping circles, moving downward over the breast until you feel your ribs below your breast.  Move one finger-width toward the center of the body.  Continue to use the  inch (2 cm) overlapping circles to feel your breast as you move slowly up toward your collarbone.  Continue the up and down exam using all three pressures until you reach your armpit.  Write Down What You Find  Write down what is normal for each breast and any changes that you find. Keep a written record with breast changes or normal findings for each breast. By writing this information down, you do not need to depend only on memory for size, tenderness, or location. Write down where you are in your menstrual cycle, if you are still menstruating. If you are having trouble noticing differences in your breasts, do not get discouraged. With time you will become more familiar with the variations in your breasts and more comfortable with the exam. How often should I examine my breasts? Examine   your breasts every month. If you are breastfeeding, the best time to examine your breasts is after a feeding or after using a breast pump. If you menstruate, the best time to examine your breasts is 5-7 days after your period is over. During your period, your breasts are lumpier, and it may be more difficult to notice changes. When should I see my health care provider? See your health care provider if you notice:  A change in shape or size of your breasts or nipples.  A change in the skin of your breast or nipples, such as a reddened or scaly area.  Unusual discharge from your nipples.  A lump or thick area that was not there before.  Pain in your breasts.  Anything that concerns you.  This information is not intended to replace advice given to you by your health care provider. Make sure you discuss any questions you have with your health care provider. Document Released: 04/29/2005 Document Revised: 10/05/2015 Document Reviewed: 03/19/2015 Elsevier Interactive Patient Education  2018 [employer]  

## 2017-12-29 LAB — CYTOLOGY - PAP
Diagnosis: NEGATIVE
HPV: NOT DETECTED

## 2018-01-13 DIAGNOSIS — Z23 Encounter for immunization: Secondary | ICD-10-CM | POA: Diagnosis not present

## 2018-01-13 DIAGNOSIS — E7800 Pure hypercholesterolemia, unspecified: Secondary | ICD-10-CM | POA: Diagnosis not present

## 2018-01-13 DIAGNOSIS — F419 Anxiety disorder, unspecified: Secondary | ICD-10-CM | POA: Diagnosis not present

## 2018-01-13 DIAGNOSIS — E559 Vitamin D deficiency, unspecified: Secondary | ICD-10-CM | POA: Diagnosis not present

## 2018-01-13 DIAGNOSIS — E039 Hypothyroidism, unspecified: Secondary | ICD-10-CM | POA: Diagnosis not present

## 2018-01-13 DIAGNOSIS — R7309 Other abnormal glucose: Secondary | ICD-10-CM | POA: Diagnosis not present

## 2018-01-13 DIAGNOSIS — Z0000 Encounter for general adult medical examination without abnormal findings: Secondary | ICD-10-CM | POA: Diagnosis not present

## 2018-01-13 DIAGNOSIS — G43909 Migraine, unspecified, not intractable, without status migrainosus: Secondary | ICD-10-CM | POA: Diagnosis not present

## 2018-01-16 ENCOUNTER — Other Ambulatory Visit: Payer: Self-pay | Admitting: Obstetrics and Gynecology

## 2018-01-16 DIAGNOSIS — M8580 Other specified disorders of bone density and structure, unspecified site: Secondary | ICD-10-CM

## 2018-01-16 DIAGNOSIS — Z1231 Encounter for screening mammogram for malignant neoplasm of breast: Secondary | ICD-10-CM

## 2018-01-21 DIAGNOSIS — H6122 Impacted cerumen, left ear: Secondary | ICD-10-CM | POA: Diagnosis not present

## 2018-01-21 DIAGNOSIS — H6063 Unspecified chronic otitis externa, bilateral: Secondary | ICD-10-CM | POA: Diagnosis not present

## 2018-03-13 ENCOUNTER — Ambulatory Visit
Admission: RE | Admit: 2018-03-13 | Discharge: 2018-03-13 | Disposition: A | Discharge status: Home / Self Care | Payer: BLUE CROSS/BLUE SHIELD | Source: Ambulatory Visit | Attending: Obstetrics and Gynecology | Admitting: Obstetrics and Gynecology

## 2018-03-13 DIAGNOSIS — Z1231 Encounter for screening mammogram for malignant neoplasm of breast: Secondary | ICD-10-CM | POA: Diagnosis not present

## 2018-03-13 DIAGNOSIS — Z780 Asymptomatic menopausal state: Secondary | ICD-10-CM | POA: Diagnosis not present

## 2018-03-13 DIAGNOSIS — M8589 Other specified disorders of bone density and structure, multiple sites: Secondary | ICD-10-CM | POA: Diagnosis not present

## 2018-03-13 DIAGNOSIS — M8580 Other specified disorders of bone density and structure, unspecified site: Secondary | ICD-10-CM

## 2018-04-24 DIAGNOSIS — Z1211 Encounter for screening for malignant neoplasm of colon: Secondary | ICD-10-CM | POA: Diagnosis not present

## 2018-06-16 DIAGNOSIS — J069 Acute upper respiratory infection, unspecified: Secondary | ICD-10-CM | POA: Diagnosis not present

## 2018-06-20 DIAGNOSIS — J209 Acute bronchitis, unspecified: Secondary | ICD-10-CM | POA: Diagnosis not present

## 2018-06-20 DIAGNOSIS — R509 Fever, unspecified: Secondary | ICD-10-CM | POA: Diagnosis not present

## 2018-06-25 DIAGNOSIS — J0100 Acute maxillary sinusitis, unspecified: Secondary | ICD-10-CM | POA: Diagnosis not present

## 2018-11-16 DIAGNOSIS — Z20828 Contact with and (suspected) exposure to other viral communicable diseases: Secondary | ICD-10-CM | POA: Diagnosis not present

## 2018-12-31 ENCOUNTER — Ambulatory Visit: Payer: BLUE CROSS/BLUE SHIELD | Admitting: Obstetrics and Gynecology

## 2019-01-05 NOTE — Progress Notes (Signed)
63 y.o. G1P0001 Married White or Caucasian Not Hispanic or Latino female here for annual exam. No vaginal bleeding. H/O VIN in 2015. She has noticed a bump on the right vulva, not itchy.  Sexually active, no pain.     Patient's last menstrual period was 10/12/2006 (approximate).          Sexually active: Yes.    The current method of family planning is post menopausal status.    Exercising: No.  The patient does not participate in regular exercise at present. Smoker:  no  Health Maintenance: Pap:  12/25/2017 WNL NEG HPV, 08/29/2015 WNL NEG HPV History of abnormal Pap:  No MMG:  03/13/2018 Birads 1 negative Colonoscopy:  04/25/2008 history of polyp, repeat in 10 years. She has had one in the last year, was told it was okay.  BMD:   03/13/2018 Osteopenia, FRAX 7/0.5% TDaP:  05/13/2006 Gardasil: none   reports that she quit smoking about 10 years ago. Her smoking use included cigarettes. She has a 15.00 pack-year smoking history. She has never used smokeless tobacco. She reports current alcohol use of about 1.0 standard drinks of alcohol per week. She reports that she does not use drugs. 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 Daughter is 52, lives in [place], 4 kids (triplets and a single).   Past Medical History:  Diagnosis Date  . Abnormal pap 01/2002   cervicitis  . Hypothyroid   . Migraine with aura    on inderal and prozac  . Osteoporosis    never has taken medication tx.   Marland Kitchen VIN I (vulvar intraepithelial neoplasia I) 12/07/13    Past Surgical History:  Procedure Laterality Date  . DILATION AND CURETTAGE OF UTERUS    . FOOT SURGERY  2013   left foot plantar fascitis reconstuction of great toe    Current Outpatient Medications  Medication Sig Dispense Refill  . Calcium 1200-1000 MG-UNIT CHEW Chew by mouth.    . Cholecalciferol (VITAMIN D-3) 1000 UNITS CAPS Take by mouth.    Marland Kitchen FLUoxetine (PROZAC) 10 MG capsule Take 10 mg by mouth daily.    Marland Kitchen levothyroxine (SYNTHROID,  LEVOTHROID) 88 MCG tablet TAKE 1 TABLET BY MOUTH EVERY MORNING ON EMPTY STOMACH ONCE DAILY  1  . lovastatin (MEVACOR) 40 MG tablet Take 40 mg by mouth at bedtime.    . Omega-3 Fatty Acids (FISH OIL PO) Take by mouth.    . propranolol (INDERAL) 20 MG tablet Take 20 mg by mouth daily.    . SUMAtriptan (IMITREX) 100 MG tablet Take 1 tablet by mouth as needed.      No current facility-administered medications for this visit.     Family History  Problem Relation Age of Onset  . Cancer Father        skin  . Pulmonary fibrosis Father   . Other Sister 29       Pre-diabetes (2018)  . Diabetes Maternal Uncle   . Cancer - Colon Maternal Grandfather        colon  Mother with dementia.   Review of Systems  All other systems reviewed and are negative.   Exam:   BP 110/62   Pulse 71   Temp (!) 97 F (36.1 C)   Resp (!) 98   Ht 5' 3.25" (1.607 m)   Wt 194 lb 6.4 oz (88.2 kg)   LMP 10/12/2006 (Approximate)   BMI 34.16 kg/m   Weight change: @WEIGHTCHANGE @ Height:   Height: 5' 3.25" (  160.7 cm)  Ht Readings from Last 3 Encounters:  01/11/19 5' 3.25" (1.607 m)  12/25/17 5\' 3"  (1.6 m)  09/04/16 5' 3.75" (1.619 m)    General appearance: alert, cooperative and appears stated age Head: Normocephalic, without obvious abnormality, atraumatic Neck: no adenopathy, supple, symmetrical, trachea midline and thyroid normal to inspection and palpation Lungs: clear to auscultation bilaterally Cardiovascular: regular rate and rhythm Breasts: normal appearance, no masses or tenderness Abdomen: soft, non-tender; non distended,  no masses,  no organomegaly Extremities: extremities normal, atraumatic, no cyanosis or edema Skin: Skin color, texture, turgor normal. No rashes or lesions Lymph nodes: Cervical, supraclavicular, and axillary nodes normal. No abnormal inguinal nodes palpated Neurologic: Grossly normal   Pelvic: External genitalia:  no lesions, sebaceous cyst on right inner labia majora  (next to minora)              Urethra:  normal appearing urethra with no masses, tenderness or lesions              Bartholins and Skenes: normal                 Vagina: normal appearing vagina with normal color and discharge, no lesions              Cervix: no lesions               Bimanual Exam:  Uterus:  normal size, contour, position, consistency, mobility, non-tender              Adnexa: no mass, fullness, tenderness               Rectovaginal: Confirms               Anus:  normal sphincter tone, no lesions  Chaperone was present for exam.  A:  Well Woman with normal exam  Osteopenia, no longer with elevated risk of fracture. FRAX is 7/0.5%  H/O VIN in 2015  P:   No pap this year  Mammogram in 11/20  Colonoscopy due  DEXA in 11/21  Discussed breast self exam  Discussed calcium and vit D intake  Recommended exercise

## 2019-01-08 ENCOUNTER — Other Ambulatory Visit: Payer: Self-pay

## 2019-01-11 ENCOUNTER — Other Ambulatory Visit: Payer: Self-pay

## 2019-01-11 ENCOUNTER — Ambulatory Visit: Payer: BC Managed Care – PPO | Admitting: Obstetrics and Gynecology

## 2019-01-11 ENCOUNTER — Encounter: Payer: Self-pay | Admitting: Obstetrics and Gynecology

## 2019-01-11 VITALS — BP 110/62 | HR 71 | Temp 97.0°F | Resp 98 | Ht 63.25 in | Wt 194.4 lb

## 2019-01-11 DIAGNOSIS — M8580 Other specified disorders of bone density and structure, unspecified site: Secondary | ICD-10-CM

## 2019-01-11 DIAGNOSIS — Z01419 Encounter for gynecological examination (general) (routine) without abnormal findings: Secondary | ICD-10-CM

## 2019-01-11 NOTE — Patient Instructions (Signed)

## 2019-01-25 DIAGNOSIS — E7800 Pure hypercholesterolemia, unspecified: Secondary | ICD-10-CM | POA: Diagnosis not present

## 2019-01-25 DIAGNOSIS — E559 Vitamin D deficiency, unspecified: Secondary | ICD-10-CM | POA: Diagnosis not present

## 2019-01-25 DIAGNOSIS — E039 Hypothyroidism, unspecified: Secondary | ICD-10-CM | POA: Diagnosis not present

## 2019-01-25 DIAGNOSIS — F419 Anxiety disorder, unspecified: Secondary | ICD-10-CM | POA: Diagnosis not present

## 2019-01-25 DIAGNOSIS — Z23 Encounter for immunization: Secondary | ICD-10-CM | POA: Diagnosis not present

## 2019-01-25 DIAGNOSIS — Z0000 Encounter for general adult medical examination without abnormal findings: Secondary | ICD-10-CM | POA: Diagnosis not present

## 2019-02-08 DIAGNOSIS — S3992XA Unspecified injury of lower back, initial encounter: Secondary | ICD-10-CM | POA: Diagnosis not present

## 2019-02-08 DIAGNOSIS — S39012A Strain of muscle, fascia and tendon of lower back, initial encounter: Secondary | ICD-10-CM | POA: Diagnosis not present

## 2019-06-29 DIAGNOSIS — E7800 Pure hypercholesterolemia, unspecified: Secondary | ICD-10-CM | POA: Diagnosis not present

## 2019-06-29 DIAGNOSIS — E039 Hypothyroidism, unspecified: Secondary | ICD-10-CM | POA: Diagnosis not present

## 2019-06-29 DIAGNOSIS — G43909 Migraine, unspecified, not intractable, without status migrainosus: Secondary | ICD-10-CM | POA: Diagnosis not present

## 2019-06-29 DIAGNOSIS — F419 Anxiety disorder, unspecified: Secondary | ICD-10-CM | POA: Diagnosis not present

## 2019-09-10 DIAGNOSIS — M722 Plantar fascial fibromatosis: Secondary | ICD-10-CM | POA: Diagnosis not present

## 2020-01-10 NOTE — Progress Notes (Signed)
64 y.o. G1P0001 Married White or Caucasian Not Hispanic or Latino female here for annual exam.   She notices a vaginal odor intermittently over the last few months. No itching, burning or irritation. Not sexually active, no vaginal bleeding.     Patient's last menstrual period was 10/12/2006 (approximate).          Sexually active: No.  The current method of family planning is post menopausal status.    Exercising: No.  The patient does not participate in regular exercise at present. Smoker:  no  Health Maintenance: Pap: 12/25/2017 WNL NEG HPV, 08/29/2015 WNL NEG HPV History of abnormal Pap:  no MMG:  03/17/18 Density B Bi-rads 1 neg  BMD:   03/13/2018 Osteopenia, FRAX 7/0.5% Colonoscopy: 2019, told okay. 04/25/2008 history of polyp, TDaP:  UTD with primary Gardasil: NA   reports that she quit smoking about 11 years ago. Her smoking use included cigarettes. She has a 15.00 pack-year smoking history. She has never used smokeless tobacco. She reports current alcohol use of about 1.0 standard drink of alcohol per week. 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 Daughter is 60, lives in [place], 4 kids (triplets and a single).71 year old triplets (one boy, 39 girls), 34 year old girl. Daughter and family are in [place] for 2 years.    Past Medical History:  Diagnosis Date  . Abnormal pap 01/2002   cervicitis  . Hypothyroid   . Migraine with aura    on inderal and prozac  . Osteoporosis    never has taken medication tx.   Marland Kitchen VIN I (vulvar intraepithelial neoplasia I) 12/07/13    Past Surgical History:  Procedure Laterality Date  . DILATION AND CURETTAGE OF UTERUS    . FOOT SURGERY  2013   left foot plantar fascitis reconstuction of great toe    Current Outpatient Medications  Medication Sig Dispense Refill  . Calcium 1200-1000 MG-UNIT CHEW Chew by mouth.    . Cholecalciferol (VITAMIN D-3) 1000 UNITS CAPS Take by mouth.    Marland Kitchen FLUoxetine (PROZAC)  10 MG capsule Take 10 mg by mouth daily.    Marland Kitchen levothyroxine (SYNTHROID, LEVOTHROID) 88 MCG tablet TAKE 1 TABLET BY MOUTH EVERY MORNING ON EMPTY STOMACH ONCE DAILY  1  . lovastatin (MEVACOR) 40 MG tablet Take 40 mg by mouth at bedtime.    . propranolol (INDERAL) 20 MG tablet Take 20 mg by mouth daily.    . SUMAtriptan (IMITREX) 100 MG tablet Take 1 tablet by mouth as needed.      No current facility-administered medications for this visit.    Family History  Problem Relation Age of Onset  . Cancer Father        skin  . Pulmonary fibrosis Father   . Other Sister 33       Pre-diabetes (2018)  . Diabetes Maternal Uncle   . Cancer - Colon Maternal Grandfather        colon    Review of Systems  Genitourinary:       Vaginal odor  Psychiatric/Behavioral: The patient is nervous/anxious.   All other systems reviewed and are negative.   Exam:   BP 108/64   Pulse 80   Ht 5' 3.5" (1.613 m)   Wt 200 lb 3.2 oz (90.8 kg)   LMP 10/12/2006 (Approximate)   SpO2 99%   BMI 34.91 kg/m   Weight change: @WEIGHTCHANGE @ Height:   Height: 5' 3.5" (161.3 cm)  Ht Readings from Last 3 Encounters:  01/19/20 5' 3.5" (1.613 m)  01/11/19 5' 3.25" (1.607 m)  12/25/17 5\' 3"  (1.6 m)    General appearance: alert, cooperative and appears stated age Head: Normocephalic, without obvious abnormality, atraumatic Neck: no adenopathy, supple, symmetrical, trachea midline and thyroid normal to inspection and palpation Lungs: clear to auscultation bilaterally Cardiovascular: regular rate and rhythm Breasts: normal appearance, no masses or tenderness Abdomen: soft, non-tender; non distended,  no masses,  no organomegaly Extremities: extremities normal, atraumatic, no cyanosis or edema Skin: Skin color, texture, turgor normal. No rashes or lesions Lymph nodes: Cervical, supraclavicular, and axillary nodes normal. No abnormal inguinal nodes palpated Neurologic: Grossly normal   Pelvic: External genitalia:  no  lesions              Urethra:  normal appearing urethra with no masses, tenderness or lesions              Bartholins and Skenes: normal                 Vagina: mildly atrophic vagina with a slight increase in thin white vaginal d/c              Cervix: no lesions               Bimanual Exam:  Uterus:  normal size, contour, position, consistency, mobility, non-tender              Adnexa: no mass, fullness, tenderness               Rectovaginal: Confirms               Anus:  normal sphincter tone, no lesions  chaperoned for the exam.  A:  Well Woman with normal exam  H/O osteopenia  H/O VIN  Vaginal odor  P:   No pap this year  Mammogram overdue, she will schedule  DEXA in 11/21, order placed  Discussed breast self exam  Discussed calcium and vit D intake  Labs with primary  Send nuswab for BV  Try replense, discussed the option of vaginal estrogen.

## 2020-01-19 ENCOUNTER — Encounter: Payer: Self-pay | Admitting: Obstetrics and Gynecology

## 2020-01-19 ENCOUNTER — Ambulatory Visit: Payer: BC Managed Care – PPO | Admitting: Obstetrics and Gynecology

## 2020-01-19 ENCOUNTER — Other Ambulatory Visit: Payer: Self-pay

## 2020-01-19 VITALS — BP 108/64 | HR 80 | Ht 63.5 in | Wt 200.2 lb

## 2020-01-19 DIAGNOSIS — M8580 Other specified disorders of bone density and structure, unspecified site: Secondary | ICD-10-CM

## 2020-01-19 DIAGNOSIS — N898 Other specified noninflammatory disorders of vagina: Secondary | ICD-10-CM | POA: Diagnosis not present

## 2020-01-19 DIAGNOSIS — Z01419 Encounter for gynecological examination (general) (routine) without abnormal findings: Secondary | ICD-10-CM

## 2020-01-19 NOTE — Patient Instructions (Addendum)
Try replense vaginal moisturizer.   EXERCISE AND DIET:  We recommended that you start or continue a regular exercise program for good health. Regular exercise means any activity that makes your heart beat faster and makes you sweat.  We recommend exercising at least 30 minutes per day at least 3 days a week, preferably 4 or 5.  We also recommend a diet low in fat and sugar.  Inactivity, poor dietary choices and obesity can cause diabetes, heart attack, stroke, and kidney damage, among others.    ALCOHOL AND SMOKING:  Women should limit their alcohol intake to no more than 7 drinks/beers/glasses of wine (combined, not each!) per week. Moderation of alcohol intake to this level decreases your risk of breast cancer and liver damage. And of course, no recreational drugs are part of a healthy lifestyle.  And absolutely no smoking or even second hand smoke. Most people know smoking can cause heart and lung diseases, but did you know it also contributes to weakening of your bones? Aging of your skin?  Yellowing of your teeth and nails?  CALCIUM AND VITAMIN D:  Adequate intake of calcium and Vitamin D are recommended.  The recommendations for exact amounts of these supplements seem to change often, but generally speaking 1,200 mg of calcium (between diet and supplement) and 800 units of Vitamin D per day seems prudent. Certain women may benefit from higher intake of Vitamin D.  If you are among these women, your doctor will have told you during your visit.    PAP SMEARS:  Pap smears, to check for cervical cancer or precancers,  have traditionally been done yearly, although recent scientific advances have shown that most women can have pap smears less often.  However, every woman still should have a physical exam from her gynecologist every year. It will include a breast check, inspection of the vulva and vagina to check for abnormal growths or skin changes, a visual exam of the cervix, and then an exam to evaluate  the size and shape of the uterus and ovaries.  And after 62 years of age, a rectal exam is indicated to check for rectal cancers. We will also provide age appropriate advice regarding health maintenance, like when you should have certain vaccines, screening for sexually transmitted diseases, bone density testing, colonoscopy, mammograms, etc.   MAMMOGRAMS:  All women over 51 years old should have a yearly mammogram. Many facilities now offer a "3D" mammogram, which may cost around $50 extra out of pocket. If possible,  we recommend you accept the option to have the 3D mammogram performed.  It both reduces the number of women who will be called back for extra views which then turn out to be normal, and it is better than the routine mammogram at detecting truly abnormal areas.    COLON CANCER SCREENING: Now recommend starting at age 80. At this time colonoscopy is not covered for routine screening until 50. There are take home tests that can be done between 45-49.   COLONOSCOPY:  Colonoscopy to screen for colon cancer is recommended for all women at age 75.  We know, you hate the idea of the prep.  We agree, BUT, having colon cancer and not knowing it is worse!!  Colon cancer so often starts as a polyp that can be seen and removed at colonscopy, which can quite literally save your life!  And if your first colonoscopy is normal and you have no family history of colon cancer, most women don't  have to have it again for 10 years.  Once every ten years, you can do something that may end up saving your life, right?  We will be happy to help you get it scheduled when you are ready.  Be sure to check your insurance coverage so you understand how much it will cost.  It may be covered as a preventative service at no cost, but you should check your particular policy.      Breast Self-Awareness Breast self-awareness means being familiar with how your breasts look and feel. It involves checking your breasts regularly  and reporting any changes to your health care provider. Practicing breast self-awareness is important. A change in your breasts can be a sign of a serious medical problem. Being familiar with how your breasts look and feel allows you to find any problems early, when treatment is more likely to be successful. All women should practice breast self-awareness, including women who have had breast implants. How to do a breast self-exam One way to learn what is normal for your breasts and whether your breasts are changing is to do a breast self-exam. To do a breast self-exam: Look for Changes  1. Remove all the clothing above your waist. 2. Stand in front of a mirror in a room with good lighting. 3. Put your hands on your hips. 4. Push your hands firmly downward. 5. Compare your breasts in the mirror. Look for differences between them (asymmetry), such as: ? Differences in shape. ? Differences in size. ? Puckers, dips, and bumps in one breast and not the other. 6. Look at each breast for changes in your skin, such as: ? Redness. ? Scaly areas. 7. Look for changes in your nipples, such as: ? Discharge. ? Bleeding. ? Dimpling. ? Redness. ? A change in position. Feel for Changes Carefully feel your breasts for lumps and changes. It is best to do this while lying on your back on the floor and again while sitting or standing in the shower or tub with soapy water on your skin. Feel each breast in the following way:  Place the arm on the side of the breast you are examining above your head.  Feel your breast with the other hand.  Start in the nipple area and make  inch (2 cm) overlapping circles to feel your breast. Use the pads of your three middle fingers to do this. Apply light pressure, then medium pressure, then firm pressure. The light pressure will allow you to feel the tissue closest to the skin. The medium pressure will allow you to feel the tissue that is a little deeper. The firm pressure  will allow you to feel the tissue close to the ribs.  Continue the overlapping circles, moving downward over the breast until you feel your ribs below your breast.  Move one finger-width toward the center of the body. Continue to use the  inch (2 cm) overlapping circles to feel your breast as you move slowly up toward your collarbone.  Continue the up and down exam using all three pressures until you reach your armpit.  Write Down What You Find  Write down what is normal for each breast and any changes that you find. Keep a written record with breast changes or normal findings for each breast. By writing this information down, you do not need to depend only on memory for size, tenderness, or location. Write down where you are in your menstrual cycle, if you are still menstruating. If   you are having trouble noticing differences in your breasts, do not get discouraged. With time you will become more familiar with the variations in your breasts and more comfortable with the exam. How often should I examine my breasts? Examine your breasts every month. If you are breastfeeding, the best time to examine your breasts is after a feeding or after using a breast pump. If you menstruate, the best time to examine your breasts is 5-7 days after your period is over. During your period, your breasts are lumpier, and it may be more difficult to notice changes. When should I see my health care provider? See your health care provider if you notice:  A change in shape or size of your breasts or nipples.  A change in the skin of your breast or nipples, such as a reddened or scaly area.  Unusual discharge from your nipples.  A lump or thick area that was not there before.  Pain in your breasts.  Anything that concerns you.  

## 2020-01-22 LAB — BACTERIAL VAGINOSIS, NAA

## 2020-02-03 DIAGNOSIS — E559 Vitamin D deficiency, unspecified: Secondary | ICD-10-CM | POA: Diagnosis not present

## 2020-02-03 DIAGNOSIS — E7800 Pure hypercholesterolemia, unspecified: Secondary | ICD-10-CM | POA: Diagnosis not present

## 2020-02-03 DIAGNOSIS — Z0000 Encounter for general adult medical examination without abnormal findings: Secondary | ICD-10-CM | POA: Diagnosis not present

## 2020-02-03 DIAGNOSIS — E039 Hypothyroidism, unspecified: Secondary | ICD-10-CM | POA: Diagnosis not present

## 2020-02-03 DIAGNOSIS — R7309 Other abnormal glucose: Secondary | ICD-10-CM | POA: Diagnosis not present

## 2020-02-03 DIAGNOSIS — F419 Anxiety disorder, unspecified: Secondary | ICD-10-CM | POA: Diagnosis not present

## 2020-02-04 ENCOUNTER — Other Ambulatory Visit: Payer: Self-pay | Admitting: Obstetrics and Gynecology

## 2020-02-04 DIAGNOSIS — Z1231 Encounter for screening mammogram for malignant neoplasm of breast: Secondary | ICD-10-CM

## 2020-02-23 ENCOUNTER — Ambulatory Visit
Admission: RE | Admit: 2020-02-23 | Discharge: 2020-02-23 | Disposition: A | Discharge status: Home / Self Care | Payer: BC Managed Care – PPO | Source: Ambulatory Visit | Attending: Obstetrics and Gynecology | Admitting: Obstetrics and Gynecology

## 2020-02-23 ENCOUNTER — Other Ambulatory Visit: Payer: Self-pay

## 2020-02-23 DIAGNOSIS — Z1231 Encounter for screening mammogram for malignant neoplasm of breast: Secondary | ICD-10-CM

## 2020-05-16 ENCOUNTER — Other Ambulatory Visit: Payer: Self-pay

## 2020-05-16 ENCOUNTER — Ambulatory Visit
Admission: RE | Admit: 2020-05-16 | Discharge: 2020-05-16 | Disposition: A | Discharge status: Home / Self Care | Payer: BLUE CROSS/BLUE SHIELD | Source: Ambulatory Visit | Attending: Obstetrics and Gynecology | Admitting: Obstetrics and Gynecology

## 2020-05-16 DIAGNOSIS — M8589 Other specified disorders of bone density and structure, multiple sites: Secondary | ICD-10-CM | POA: Diagnosis not present

## 2020-05-16 DIAGNOSIS — Z780 Asymptomatic menopausal state: Secondary | ICD-10-CM | POA: Diagnosis not present

## 2020-05-16 DIAGNOSIS — M8580 Other specified disorders of bone density and structure, unspecified site: Secondary | ICD-10-CM

## 2020-08-01 DIAGNOSIS — L309 Dermatitis, unspecified: Secondary | ICD-10-CM | POA: Diagnosis not present

## 2020-08-01 DIAGNOSIS — L821 Other seborrheic keratosis: Secondary | ICD-10-CM | POA: Diagnosis not present

## 2020-08-02 DIAGNOSIS — R7303 Prediabetes: Secondary | ICD-10-CM | POA: Diagnosis not present

## 2020-08-02 DIAGNOSIS — E7800 Pure hypercholesterolemia, unspecified: Secondary | ICD-10-CM | POA: Diagnosis not present

## 2020-08-02 DIAGNOSIS — E039 Hypothyroidism, unspecified: Secondary | ICD-10-CM | POA: Diagnosis not present

## 2020-08-02 DIAGNOSIS — F419 Anxiety disorder, unspecified: Secondary | ICD-10-CM | POA: Diagnosis not present

## 2020-08-02 DIAGNOSIS — E669 Obesity, unspecified: Secondary | ICD-10-CM | POA: Diagnosis not present

## 2020-10-25 DIAGNOSIS — G43109 Migraine with aura, not intractable, without status migrainosus: Secondary | ICD-10-CM | POA: Diagnosis not present

## 2020-10-25 DIAGNOSIS — G43019 Migraine without aura, intractable, without status migrainosus: Secondary | ICD-10-CM | POA: Diagnosis not present

## 2021-01-22 NOTE — Progress Notes (Signed)
65 y.o. G1P0001 Married White or Caucasian Not Hispanic or Latino female here for annual exam.  No vaginal bleeding. Not sexually active. No bowel or bladder issues.    Distant h/o VIN I in 2015.   Patient's last menstrual period was 10/12/2006 (approximate).          Sexually active: No.  The current method of family planning is post menopausal status.    Exercising: No.  The patient does not participate in regular exercise at present. Smoker:  no  Health Maintenance: Pap:  12/25/2017 WNL NEG HPV, 08/29/2015 WNL NEG HPV History of abnormal Pap:  no MMG:  02/23/20 Bi-rads 1 neg  BMD:   05/16/20 osteopenic, T score -2, FRAX 13.1/1.2 (change from 7/0.5) Colonoscopy: 2019, told okay. 04/25/2008 history of polyp, TDaP:  01/13/18 Gardasil: n/a   reports that she quit smoking about 12 years ago. Her smoking use included cigarettes. She has a 15.00 pack-year smoking history. She has never used smokeless tobacco. She reports current alcohol use of about 1.0 standard drink per week. 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 Her daughter has 19 year old triplets (one boy and two girls) and an almost 62 year old girl. She and her family are currently living in [place], husband is the [employer].   Past Medical History:  Diagnosis Date   Abnormal pap 01/2002   cervicitis   Hypothyroid    Migraine with aura    on inderal and prozac   Osteoporosis    never has taken medication tx.    VIN I (vulvar intraepithelial neoplasia I) 12/07/13    Past Surgical History:  Procedure Laterality Date   DILATION AND CURETTAGE OF UTERUS     FOOT SURGERY  2013   left foot plantar fascitis reconstuction of great toe    Current Outpatient Medications  Medication Sig Dispense Refill   Calcium 1200-1000 MG-UNIT CHEW Chew by mouth.     Cholecalciferol (VITAMIN D-3) 1000 UNITS CAPS Take by mouth.     FLUoxetine (PROZAC) 10 MG capsule Take 10 mg by mouth  daily.     levothyroxine (SYNTHROID, LEVOTHROID) 88 MCG tablet TAKE 1 TABLET BY MOUTH EVERY MORNING ON EMPTY STOMACH ONCE DAILY  1   lovastatin (MEVACOR) 40 MG tablet Take 40 mg by mouth at bedtime.     propranolol (INDERAL) 20 MG tablet Take 20 mg by mouth daily.     SUMAtriptan (IMITREX) 100 MG tablet Take 1 tablet by mouth as needed.      No current facility-administered medications for this visit.    Family History  Problem Relation Age of Onset   Cancer Father        skin   Pulmonary fibrosis Father    Other Sister 38       Pre-diabetes (2018)   Diabetes Maternal Uncle    Cancer - Colon Maternal Grandfather        colon    Review of Systems  All other systems reviewed and are negative.  Exam:   BP 120/72   Pulse 76   Ht 5\' 3"  (1.6 m)   Wt 199 lb 12.8 oz (90.6 kg)   LMP 10/12/2006 (Approximate)   SpO2 97%   BMI 35.39 kg/m   Weight change: @WEIGHTCHANGE @ Height:   Height: 5\' 3"  (160 cm)  Ht Readings from Last 3 Encounters:  01/25/21 5\' 3"  (1.6 m)  01/19/20 5' 3.5" (1.613 m)  01/11/19  5' 3.25" (1.607 m)    General appearance: alert, cooperative and appears stated age Head: Normocephalic, without obvious abnormality, atraumatic Neck: no adenopathy, supple, symmetrical, trachea midline and thyroid normal to inspection and palpation Lungs: clear to auscultation bilaterally Cardiovascular: regular rate and rhythm Breasts: normal appearance, no masses or tenderness Abdomen: soft, non-tender; non distended,  no masses,  no organomegaly Extremities: extremities normal, atraumatic, no cyanosis or edema Skin: Skin color, texture, turgor normal. No rashes or lesions Lymph nodes: Cervical, supraclavicular, and axillary nodes normal. No abnormal inguinal nodes palpated Neurologic: Grossly normal   Pelvic: External genitalia:  no lesions              Urethra:  normal appearing urethra with no masses, tenderness or lesions              Bartholins and Skenes: normal                  Vagina: atrophic appearing vagina with normal color and discharge, no lesions              Cervix: no lesions               Bimanual Exam:  Uterus:   no masses or tenderness              Adnexa: no mass, fullness, tenderness               Rectovaginal: Confirms               Anus:  normal sphincter tone, no lesions  Carolynn Serve chaperoned for the exam.  1. Well woman exam Discussed breast self exam No pap this year Mammogram next month Colonoscopy utd  2. Osteopenia, unspecified location Discussed calcium and vit D intake DEXA in 1/24

## 2021-01-25 ENCOUNTER — Other Ambulatory Visit: Payer: Self-pay

## 2021-01-25 ENCOUNTER — Encounter: Payer: Self-pay | Admitting: Obstetrics and Gynecology

## 2021-01-25 ENCOUNTER — Ambulatory Visit (INDEPENDENT_AMBULATORY_CARE_PROVIDER_SITE_OTHER): Payer: BC Managed Care – PPO | Admitting: Obstetrics and Gynecology

## 2021-01-25 VITALS — BP 120/72 | HR 76 | Ht 63.0 in | Wt 199.8 lb

## 2021-01-25 DIAGNOSIS — M8580 Other specified disorders of bone density and structure, unspecified site: Secondary | ICD-10-CM | POA: Diagnosis not present

## 2021-01-25 DIAGNOSIS — Z01419 Encounter for gynecological examination (general) (routine) without abnormal findings: Secondary | ICD-10-CM

## 2021-01-25 NOTE — Patient Instructions (Signed)

## 2021-02-14 DIAGNOSIS — M25551 Pain in right hip: Secondary | ICD-10-CM | POA: Diagnosis not present

## 2021-02-14 DIAGNOSIS — E039 Hypothyroidism, unspecified: Secondary | ICD-10-CM | POA: Diagnosis not present

## 2021-02-14 DIAGNOSIS — Z0000 Encounter for general adult medical examination without abnormal findings: Secondary | ICD-10-CM | POA: Diagnosis not present

## 2021-02-14 DIAGNOSIS — F419 Anxiety disorder, unspecified: Secondary | ICD-10-CM | POA: Diagnosis not present

## 2021-02-14 DIAGNOSIS — E7800 Pure hypercholesterolemia, unspecified: Secondary | ICD-10-CM | POA: Diagnosis not present

## 2021-02-14 DIAGNOSIS — Z23 Encounter for immunization: Secondary | ICD-10-CM | POA: Diagnosis not present

## 2021-02-21 ENCOUNTER — Other Ambulatory Visit: Payer: Self-pay | Admitting: Family Medicine

## 2021-02-21 DIAGNOSIS — Z1231 Encounter for screening mammogram for malignant neoplasm of breast: Secondary | ICD-10-CM

## 2021-02-27 DIAGNOSIS — M25551 Pain in right hip: Secondary | ICD-10-CM | POA: Diagnosis not present

## 2021-03-21 ENCOUNTER — Ambulatory Visit
Admission: RE | Admit: 2021-03-21 | Discharge: 2021-03-21 | Disposition: A | Discharge status: Home / Self Care | Payer: BC Managed Care – PPO | Source: Ambulatory Visit | Attending: Family Medicine | Admitting: Family Medicine

## 2021-03-21 ENCOUNTER — Other Ambulatory Visit: Payer: Self-pay

## 2021-03-21 DIAGNOSIS — Z1231 Encounter for screening mammogram for malignant neoplasm of breast: Secondary | ICD-10-CM

## 2021-03-23 ENCOUNTER — Other Ambulatory Visit: Payer: Self-pay | Admitting: Family Medicine

## 2021-03-23 DIAGNOSIS — R928 Other abnormal and inconclusive findings on diagnostic imaging of breast: Secondary | ICD-10-CM

## 2021-04-25 ENCOUNTER — Ambulatory Visit
Admission: RE | Admit: 2021-04-25 | Discharge: 2021-04-25 | Disposition: A | Discharge status: Home / Self Care | Payer: BC Managed Care – PPO | Source: Ambulatory Visit | Attending: Family Medicine | Admitting: Family Medicine

## 2021-04-25 ENCOUNTER — Other Ambulatory Visit: Payer: Self-pay

## 2021-04-25 ENCOUNTER — Ambulatory Visit: Payer: BC Managed Care – PPO

## 2021-04-25 DIAGNOSIS — R928 Other abnormal and inconclusive findings on diagnostic imaging of breast: Secondary | ICD-10-CM

## 2021-04-25 DIAGNOSIS — R922 Inconclusive mammogram: Secondary | ICD-10-CM | POA: Diagnosis not present

## 2021-05-03 DIAGNOSIS — R238 Other skin changes: Secondary | ICD-10-CM | POA: Diagnosis not present

## 2021-05-03 DIAGNOSIS — L309 Dermatitis, unspecified: Secondary | ICD-10-CM | POA: Diagnosis not present

## 2021-05-03 DIAGNOSIS — R229 Localized swelling, mass and lump, unspecified: Secondary | ICD-10-CM | POA: Diagnosis not present

## 2021-05-08 DIAGNOSIS — C44529 Squamous cell carcinoma of skin of other part of trunk: Secondary | ICD-10-CM | POA: Diagnosis not present

## 2021-06-28 DIAGNOSIS — C44529 Squamous cell carcinoma of skin of other part of trunk: Secondary | ICD-10-CM | POA: Diagnosis not present

## 2021-06-28 DIAGNOSIS — L988 Other specified disorders of the skin and subcutaneous tissue: Secondary | ICD-10-CM | POA: Diagnosis not present

## 2021-07-05 DIAGNOSIS — M79671 Pain in right foot: Secondary | ICD-10-CM | POA: Diagnosis not present

## 2021-07-09 DIAGNOSIS — S8491XA Injury of unspecified nerve at lower leg level, right leg, initial encounter: Secondary | ICD-10-CM | POA: Diagnosis not present

## 2021-07-09 DIAGNOSIS — M79604 Pain in right leg: Secondary | ICD-10-CM | POA: Diagnosis not present

## 2021-07-19 DIAGNOSIS — M21371 Foot drop, right foot: Secondary | ICD-10-CM | POA: Diagnosis not present

## 2021-07-19 DIAGNOSIS — M545 Low back pain, unspecified: Secondary | ICD-10-CM | POA: Diagnosis not present

## 2021-08-02 DIAGNOSIS — J40 Bronchitis, not specified as acute or chronic: Secondary | ICD-10-CM | POA: Diagnosis not present

## 2021-08-03 DIAGNOSIS — M5451 Vertebrogenic low back pain: Secondary | ICD-10-CM | POA: Diagnosis not present

## 2021-08-15 DIAGNOSIS — R051 Acute cough: Secondary | ICD-10-CM | POA: Diagnosis not present

## 2021-08-15 DIAGNOSIS — E7800 Pure hypercholesterolemia, unspecified: Secondary | ICD-10-CM | POA: Diagnosis not present

## 2021-08-15 DIAGNOSIS — G43909 Migraine, unspecified, not intractable, without status migrainosus: Secondary | ICD-10-CM | POA: Diagnosis not present

## 2021-08-15 DIAGNOSIS — E039 Hypothyroidism, unspecified: Secondary | ICD-10-CM | POA: Diagnosis not present

## 2021-08-20 DIAGNOSIS — M5451 Vertebrogenic low back pain: Secondary | ICD-10-CM | POA: Diagnosis not present

## 2021-08-28 DIAGNOSIS — M5451 Vertebrogenic low back pain: Secondary | ICD-10-CM | POA: Diagnosis not present

## 2021-10-03 DIAGNOSIS — M21371 Foot drop, right foot: Secondary | ICD-10-CM | POA: Diagnosis not present

## 2022-01-24 NOTE — Progress Notes (Unsigned)
66 y.o. G1P0001 Married White or Caucasian Not Hispanic or Latino female here for annual exam.  No vaginal bleeding. Not sexually active.    Distant h/o VIN I in 2015.   Patient's last menstrual period was 10/12/2006 (approximate).          Sexually active: No.  The current method of family planning is post menopausal status.    Exercising: No.  The patient does not participate in regular exercise at present. Smoker:  no  Health Maintenance: Pap:  12/25/17 WNL NEG HPV, 08/29/2015 WNL NEG HPV History of abnormal Pap:  no MMG:  04/25/21 Bi-rads 1 neg  BMD:  05/16/20 osteopenic, T score -2, FRAX 13.1/1.2 (change from 7/0.5) Colonoscopy: 2019, told okay. 04/25/2008 history of polyp, TDaP:  01/13/18 Gardasil: n/a   reports that she quit smoking about 13 years ago. Her smoking use included cigarettes. She has a 15.00 pack-year smoking history. She has never used smokeless tobacco. She reports current alcohol use of about 1.0 standard drink of alcohol per week. She reports that she does not use drugs. 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 Her daughter has 4 year old triplets (one boy and two girls) and an almost 64 year old girl. Her daughter has moved back from [place] and is now living in [facility] with her family.   Past Medical History:  Diagnosis Date   Abnormal pap 01/2002   cervicitis   Hypothyroid    Migraine with aura    on inderal and prozac   Osteoporosis    never has taken medication tx.    VIN I (vulvar intraepithelial neoplasia I) 12/07/13    Past Surgical History:  Procedure Laterality Date   DILATION AND CURETTAGE OF UTERUS     FOOT SURGERY  2013   left foot plantar fascitis reconstuction of great toe    Current Outpatient Medications  Medication Sig Dispense Refill   Calcium 1200-1000 MG-UNIT CHEW Chew by mouth.     Cholecalciferol (VITAMIN D-3) 1000 UNITS CAPS Take by mouth.     FLUoxetine (PROZAC) 10 MG capsule Take 10 mg by mouth daily.      levothyroxine (SYNTHROID, LEVOTHROID) 88 MCG tablet TAKE 1 TABLET BY MOUTH EVERY MORNING ON EMPTY STOMACH ONCE DAILY  1   lovastatin (MEVACOR) 40 MG tablet Take 40 mg by mouth at bedtime.     propranolol (INDERAL) 10 MG tablet Take 10 mg by mouth 3 (three) times daily.     SUMAtriptan (IMITREX) 100 MG tablet Take 1 tablet by mouth as needed.      No current facility-administered medications for this visit.    Family History  Problem Relation Age of Onset   Cancer Father        skin   Pulmonary fibrosis Father    Other Sister 4       Pre-diabetes (2018)   Diabetes Maternal Uncle    Cancer - Colon Maternal Grandfather        colon    Review of Systems  All other systems reviewed and are negative.   Exam:   BP 112/62   Pulse 61   Ht 5' 2.6" (1.59 m)   Wt 198 lb (89.8 kg)   LMP 10/12/2006 (Approximate)   SpO2 100%   BMI 35.53 kg/m   Weight change: @WEIGHTCHANGE @ Height:   Height: 5' 2.6" (159 cm)  Ht Readings from Last 3 Encounters:  01/31/22 5' 2.6" (1.59 m)  01/25/21 5\' 3"  (1.6 m)  01/19/20 5' 3.5" (1.613 m)    General appearance: alert, cooperative and appears stated age Head: Normocephalic, without obvious abnormality, atraumatic Neck: no adenopathy, supple, symmetrical, trachea midline and thyroid normal to inspection and palpation Lungs: clear to auscultation bilaterally Cardiovascular: regular rate and rhythm Breasts: normal appearance, no masses or tenderness Abdomen: soft, non-tender; non distended,  no masses,  no organomegaly Extremities: extremities normal, atraumatic, no cyanosis or edema Skin: Skin color, texture, turgor normal. No rashes or lesions Lymph nodes: Cervical, supraclavicular, and axillary nodes normal. No abnormal inguinal nodes palpated Neurologic: Grossly normal   Pelvic: External genitalia:  no lesions              Urethra:  normal appearing urethra with no masses, tenderness or lesions              Bartholins and Skenes: normal                  Vagina: normal appearing vagina with normal color and discharge, no lesions              Cervix: no lesions               Bimanual Exam:  Uterus:   no masses or tenderness              Adnexa: no mass, fullness, tenderness               Rectovaginal: Confirms               Anus:  normal sphincter tone, no lesions  Carolynn Serve, CMA chaperoned for the exam.  1. Well woman exam Discussed breast self exam Mammogram in 12/23 Colonoscopy UTD Labs with primary Pap next year  2. Osteopenia, unspecified location Getting calcium and vit d Discussed exercise - DG Bone Density; Future  3. Hypoestrogenism - DG Bone Density; Future

## 2022-01-31 ENCOUNTER — Ambulatory Visit (INDEPENDENT_AMBULATORY_CARE_PROVIDER_SITE_OTHER): Payer: BC Managed Care – PPO | Admitting: Obstetrics and Gynecology

## 2022-01-31 ENCOUNTER — Encounter: Payer: Self-pay | Admitting: Obstetrics and Gynecology

## 2022-01-31 VITALS — BP 112/62 | HR 61 | Ht 62.6 in | Wt 198.0 lb

## 2022-01-31 DIAGNOSIS — E2839 Other primary ovarian failure: Secondary | ICD-10-CM

## 2022-01-31 DIAGNOSIS — M8580 Other specified disorders of bone density and structure, unspecified site: Secondary | ICD-10-CM | POA: Diagnosis not present

## 2022-01-31 DIAGNOSIS — Z01419 Encounter for gynecological examination (general) (routine) without abnormal findings: Secondary | ICD-10-CM

## 2022-01-31 NOTE — Patient Instructions (Signed)

## 2022-03-18 ENCOUNTER — Other Ambulatory Visit: Payer: Self-pay | Admitting: Family Medicine

## 2022-03-18 ENCOUNTER — Other Ambulatory Visit: Payer: Self-pay | Admitting: Obstetrics and Gynecology

## 2022-03-18 DIAGNOSIS — E039 Hypothyroidism, unspecified: Secondary | ICD-10-CM | POA: Diagnosis not present

## 2022-03-18 DIAGNOSIS — Z1231 Encounter for screening mammogram for malignant neoplasm of breast: Secondary | ICD-10-CM

## 2022-03-18 DIAGNOSIS — G43909 Migraine, unspecified, not intractable, without status migrainosus: Secondary | ICD-10-CM | POA: Diagnosis not present

## 2022-03-18 DIAGNOSIS — F419 Anxiety disorder, unspecified: Secondary | ICD-10-CM | POA: Diagnosis not present

## 2022-03-18 DIAGNOSIS — E7800 Pure hypercholesterolemia, unspecified: Secondary | ICD-10-CM | POA: Diagnosis not present

## 2022-03-18 DIAGNOSIS — M8580 Other specified disorders of bone density and structure, unspecified site: Secondary | ICD-10-CM

## 2022-03-18 DIAGNOSIS — E2839 Other primary ovarian failure: Secondary | ICD-10-CM

## 2022-03-18 DIAGNOSIS — Z0000 Encounter for general adult medical examination without abnormal findings: Secondary | ICD-10-CM | POA: Diagnosis not present

## 2022-05-17 ENCOUNTER — Ambulatory Visit
Admission: RE | Admit: 2022-05-17 | Discharge: 2022-05-17 | Disposition: A | Discharge status: Home / Self Care | Payer: BC Managed Care – PPO | Source: Ambulatory Visit | Attending: Family Medicine | Admitting: Family Medicine

## 2022-05-17 DIAGNOSIS — Z1231 Encounter for screening mammogram for malignant neoplasm of breast: Secondary | ICD-10-CM | POA: Diagnosis not present

## 2022-08-26 ENCOUNTER — Ambulatory Visit
Admission: RE | Admit: 2022-08-26 | Discharge: 2022-08-26 | Disposition: A | Discharge status: Home / Self Care | Payer: BC Managed Care – PPO | Source: Ambulatory Visit | Attending: Obstetrics and Gynecology | Admitting: Obstetrics and Gynecology

## 2022-08-26 DIAGNOSIS — Z780 Asymptomatic menopausal state: Secondary | ICD-10-CM | POA: Diagnosis not present

## 2022-08-26 DIAGNOSIS — M8589 Other specified disorders of bone density and structure, multiple sites: Secondary | ICD-10-CM | POA: Diagnosis not present

## 2022-08-26 DIAGNOSIS — E2839 Other primary ovarian failure: Secondary | ICD-10-CM

## 2022-08-26 DIAGNOSIS — M8580 Other specified disorders of bone density and structure, unspecified site: Secondary | ICD-10-CM

## 2022-09-16 DIAGNOSIS — R944 Abnormal results of kidney function studies: Secondary | ICD-10-CM | POA: Diagnosis not present

## 2022-09-16 DIAGNOSIS — G43909 Migraine, unspecified, not intractable, without status migrainosus: Secondary | ICD-10-CM | POA: Diagnosis not present

## 2022-09-16 DIAGNOSIS — E7800 Pure hypercholesterolemia, unspecified: Secondary | ICD-10-CM | POA: Diagnosis not present

## 2022-09-16 DIAGNOSIS — E039 Hypothyroidism, unspecified: Secondary | ICD-10-CM | POA: Diagnosis not present

## 2022-09-16 DIAGNOSIS — F419 Anxiety disorder, unspecified: Secondary | ICD-10-CM | POA: Diagnosis not present

## 2023-02-05 ENCOUNTER — Ambulatory Visit: Payer: BC Managed Care – PPO | Admitting: Obstetrics and Gynecology

## 2023-02-26 ENCOUNTER — Ambulatory Visit (INDEPENDENT_AMBULATORY_CARE_PROVIDER_SITE_OTHER): Payer: BC Managed Care – PPO | Admitting: Obstetrics and Gynecology

## 2023-02-26 ENCOUNTER — Encounter: Payer: Self-pay | Admitting: Obstetrics and Gynecology

## 2023-02-26 ENCOUNTER — Other Ambulatory Visit (HOSPITAL_COMMUNITY)
Admission: RE | Admit: 2023-02-26 | Discharge: 2023-02-26 | Disposition: A | Discharge status: Home / Self Care | Payer: BC Managed Care – PPO | Source: Ambulatory Visit | Attending: Obstetrics and Gynecology | Admitting: Obstetrics and Gynecology

## 2023-02-26 VITALS — BP 114/70 | HR 63 | Ht 62.75 in | Wt 201.0 lb

## 2023-02-26 DIAGNOSIS — N951 Menopausal and female climacteric states: Secondary | ICD-10-CM | POA: Diagnosis not present

## 2023-02-26 DIAGNOSIS — M8580 Other specified disorders of bone density and structure, unspecified site: Secondary | ICD-10-CM | POA: Diagnosis not present

## 2023-02-26 DIAGNOSIS — Z01419 Encounter for gynecological examination (general) (routine) without abnormal findings: Secondary | ICD-10-CM | POA: Diagnosis not present

## 2023-02-26 DIAGNOSIS — E2839 Other primary ovarian failure: Secondary | ICD-10-CM

## 2023-02-26 NOTE — Progress Notes (Signed)
67 y.o. y.o. female here for annual exam. She denies any PM bleeding.   Patient's last menstrual period was 10/12/2006 (approximate).      Distant h/o VIN I in 2015.   Patient's last menstrual period was 10/12/2006 (approximate).          Sexually active: No.  The current method of family planning is post menopausal status.    Exercising: No.  The patient does not participate in regular exercise at present. Smoker:  no  Health Maintenance: Pap:  12/25/17 WNL NEG HPV, 08/29/2015 WNL NEG HPV, 02/26/23 pap smear sent History of abnormal Pap:  no MMG:  05/17/22 Bi-rads 1 neg  BMD:  05/16/20 osteopenic, T score -2, FRAX 13.1/1.2 (change from 7/0.5), 08/26/22 -2.3 neg FRAX medications. On no medications. h/o 2 fractures in leg. Information given on evenity and prolia. Repeat DXA in one year Blood pressure 114/70, pulse 63, height 5' 2.75" (1.594 m), weight 201 lb (91.2 kg), last menstrual period 10/12/2006, SpO2 98%.   Body mass index is 35.89 kg/m.     Component Value Date/Time   DIAGPAP  12/25/2017 0000    NEGATIVE FOR INTRAEPITHELIAL LESIONS OR MALIGNANCY.   ADEQPAP  12/25/2017 0000    Satisfactory for evaluation  endocervical/transformation zone component PRESENT.    GYN HISTORY:    Component Value Date/Time   DIAGPAP  12/25/2017 0000    NEGATIVE FOR INTRAEPITHELIAL LESIONS OR MALIGNANCY.   ADEQPAP  12/25/2017 0000    Satisfactory for evaluation  endocervical/transformation zone component PRESENT.    OB History  Gravida Para Term Preterm AB Living  1 1 0 0 0 1  SAB IAB Ectopic Multiple Live Births  0 0 0 0 1    # Outcome Date GA Lbr Len/2nd Weight Sex Type Anes PTL Lv  1 Para 1981   7 lb 6 oz (3.345 kg) F Vag-Spont   LIV    Past Medical History:  Diagnosis Date   Abnormal pap 01/2002   cervicitis   Hypothyroid    Migraine with aura    on inderal and prozac   Osteoporosis    never has taken medication tx.    VIN I (vulvar intraepithelial neoplasia I) 12/07/13     Past Surgical History:  Procedure Laterality Date   DILATION AND CURETTAGE OF UTERUS     FOOT SURGERY  2013   left foot plantar fascitis reconstuction of great toe    Current Outpatient Medications on File Prior to Visit  Medication Sig Dispense Refill   Calcium Citrate-Vitamin D (CALCIUM + D PO) Take by mouth.     Cholecalciferol (VITAMIN D-3) 1000 UNITS CAPS Take by mouth.     FLUoxetine (PROZAC) 10 MG capsule Take 10 mg by mouth daily.     levothyroxine (SYNTHROID, LEVOTHROID) 88 MCG tablet TAKE 1 TABLET BY MOUTH EVERY MORNING ON EMPTY STOMACH ONCE DAILY  1   lovastatin (MEVACOR) 40 MG tablet Take 40 mg by mouth at bedtime.     propranolol (INDERAL) 10 MG tablet Take 10 mg by mouth 2 (two) times daily.     SUMAtriptan (IMITREX) 100 MG tablet Take 1 tablet by mouth as needed.      No current facility-administered medications on file prior to visit.    Social History   Socioeconomic History   Marital status: Married    Spouse name: Jolea Dolle   Number of children: 1   Years of education: 14   Highest education level: Not on file  Occupational History   Occupation: Economist: [employer]  Tobacco Use   Smoking status: Former    Current packs/day: 0.00    Average packs/day: 0.5 packs/day for 30.0 years (15.0 ttl pk-yrs)    Types: Cigarettes    Start date: 09/11/1978    Quit date: 09/10/2008    Years since quitting: 14.4   Smokeless tobacco: Never  Vaping Use   Vaping status: Never Used  Substance and Sexual Activity   Alcohol use: Yes    Alcohol/week: 1.0 standard drink of alcohol    Types: 1 Standard drinks or equivalent per week    Comment: occ beer   Drug use: No   Sexual activity: Not Currently    Partners: Male    Birth control/protection: Post-menopausal  Other Topics Concern   Not on file  Social History Narrative   Not on file   Social Determinants of Health   Financial Resource Strain: Not on file  Food Insecurity: No Food  Insecurity (10/25/2020)   Received from [facility]   Hunger Vital Sign    Worried About Running Out of Food in the Last Year: Never true    Ran Out of Food in the Last Year: Never true  Transportation Needs: Not on file  Physical Activity: Not on file  Stress: Not on file  Social Connections: Unknown (09/25/2021)   Received from [facility]   Social Network    Social Network: Not on file  Intimate Partner Violence: Unknown (08/17/2021)   [facility] from [facility]   HITS    Physically Hurt: Not on file    Insult or Talk Down To: Not on file    Threaten Physical Harm: Not on file    Scream or Curse: Not on file    Family History  Problem Relation Age of Onset   Cancer Father        skin   Pulmonary fibrosis Father    Other Sister 39       Pre-diabetes (2018)   Diabetes Maternal Uncle    Cancer - Colon Maternal Grandfather        colon     Allergies  Allergen Reactions   Cephalexin     Other Reaction(s): face red and swollen   Ergotamine    Other Nausea And Vomiting    ergomite      Patient's last menstrual period was Patient's last menstrual period was 10/12/2006 (approximate)..            Review of Systems Alls systems reviewed and are negative.     Physical Exam Genitourinary:     Moderate vaginal atrophy present.      A:         Well Woman GYN exam                             P:        Pap smear collected today Encouraged annual mammogram screening Colon cancer screening up-to-date Due in 2029 as final one DXA up-to-date Discussed Evenity followed by proloia with oteoporosis bone scan.  To repeat in one year, due to drop from last one and being borderline osteoporosis at -2.3 Labs and immunizations to do with PMD Discussed breast self exams Encouraged healthy lifestyle practices Encouraged Vit D and Calcium   No follow-ups on file.  Earley Favor

## 2023-02-27 MED ORDER — ROMOSOZUMAB-AQQG 105 MG/1.17ML ~~LOC~~ SOSY: 210.0000 mg | PREFILLED_SYRINGE | Freq: Once | SUBCUTANEOUS | Status: AC

## 2023-02-27 MED ORDER — DENOSUMAB 60 MG/ML ~~LOC~~ SOSY
60.0000 mg | PREFILLED_SYRINGE | Freq: Once | SUBCUTANEOUS | Status: AC
Start: 2023-02-27 — End: ?

## 2023-02-27 NOTE — Addendum Note (Signed)
Addended by: Earley Favor on: 02/27/2023 01:20 PM   Modules accepted: Orders

## 2023-02-28 LAB — CYTOLOGY - PAP
Comment: NEGATIVE
Diagnosis: NEGATIVE
High risk HPV: NEGATIVE

## 2023-03-03 ENCOUNTER — Encounter: Payer: Self-pay | Admitting: Obstetrics and Gynecology

## 2023-04-18 ENCOUNTER — Other Ambulatory Visit: Payer: Self-pay | Admitting: Family Medicine

## 2023-04-18 DIAGNOSIS — Z1231 Encounter for screening mammogram for malignant neoplasm of breast: Secondary | ICD-10-CM

## 2023-05-20 DIAGNOSIS — Z1231 Encounter for screening mammogram for malignant neoplasm of breast: Secondary | ICD-10-CM

## 2023-07-22 IMAGING — MG MM DIGITAL DIAGNOSTIC UNILAT*R* W/ TOMO W/ CAD
4 series · 4 of 12 positions shown · non-contrast
Comparison: Previous exam(s).

CLINICAL DATA: Patient was recalled from screening mammogram for a
possible asymmetry in the right breast.

EXAM:
DIGITAL DIAGNOSTIC UNILATERAL RIGHT MAMMOGRAM WITH TOMOSYNTHESIS AND
CAD
TECHNIQUE: Right digital diagnostic mammography and breast tomosynthesis was
performed. The images were evaluated with computer-aided detection.

[R ML synth-2D]
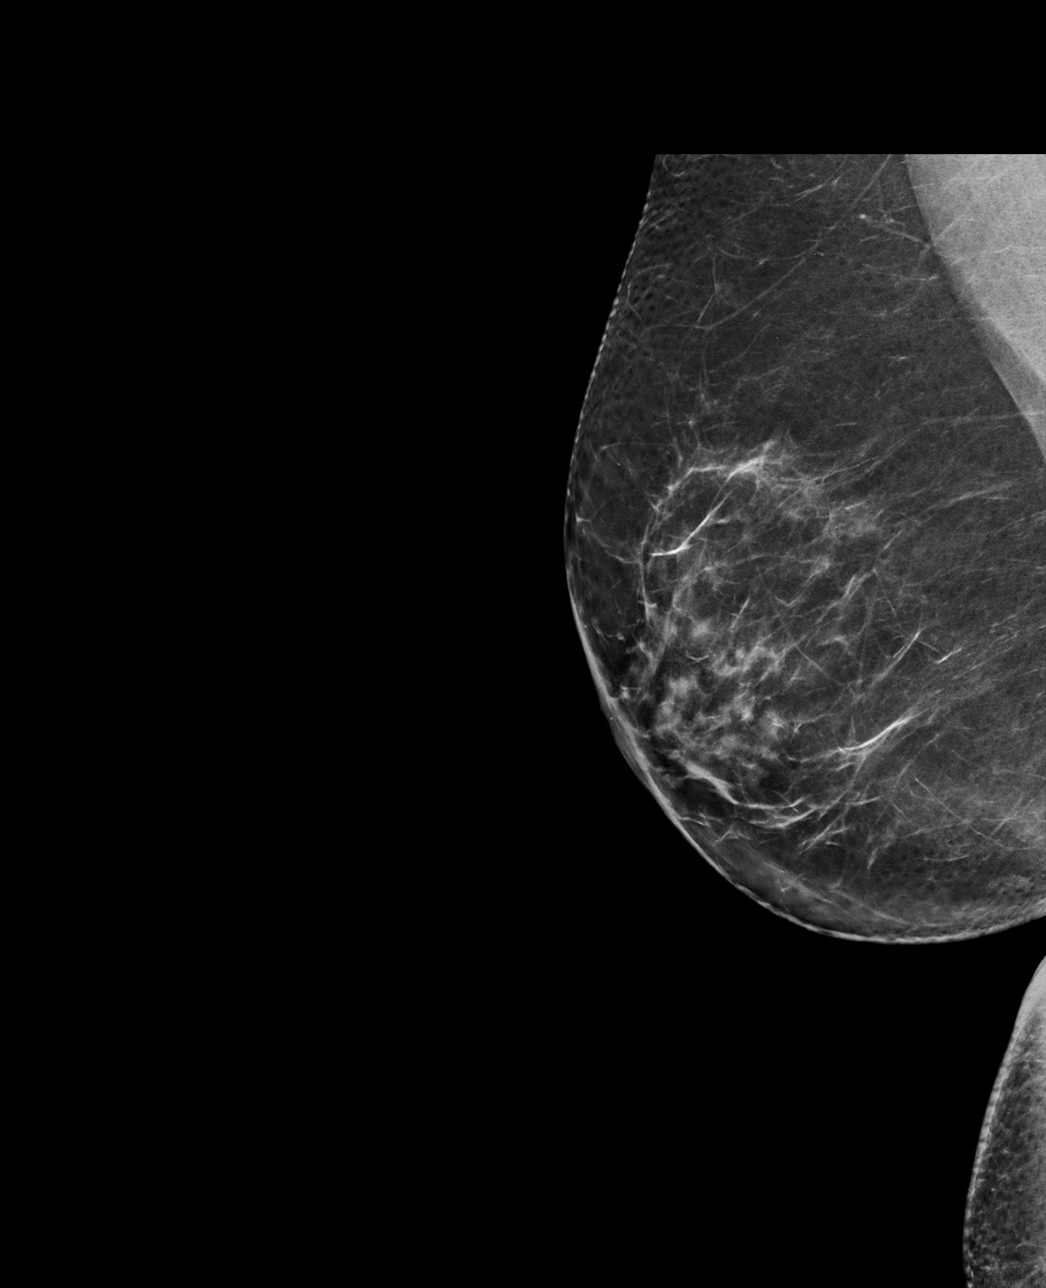

[R CC synth-2D]
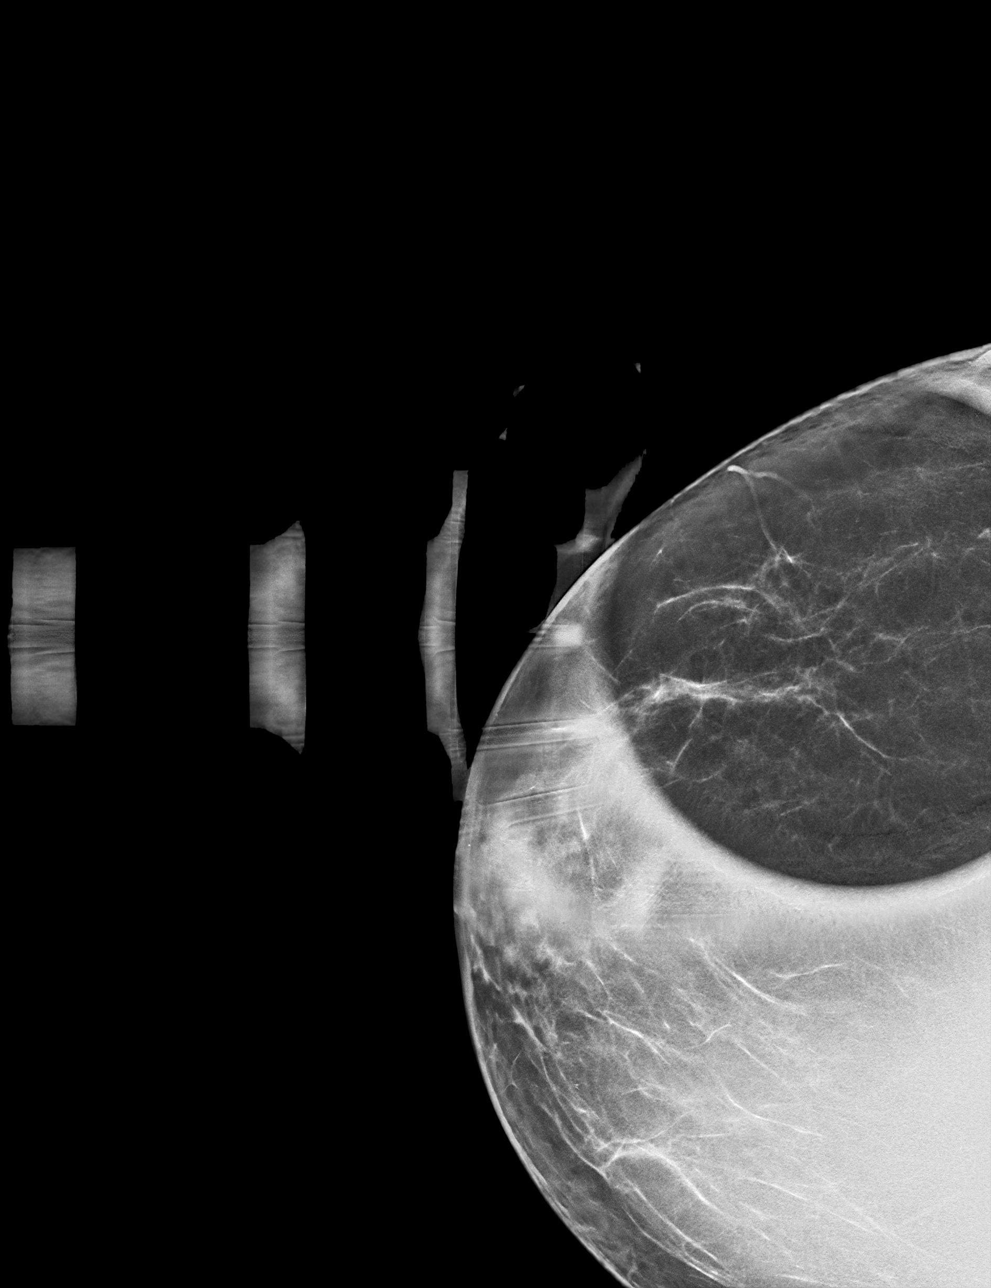

[R CC tomo · tomo slice 30/59.0]
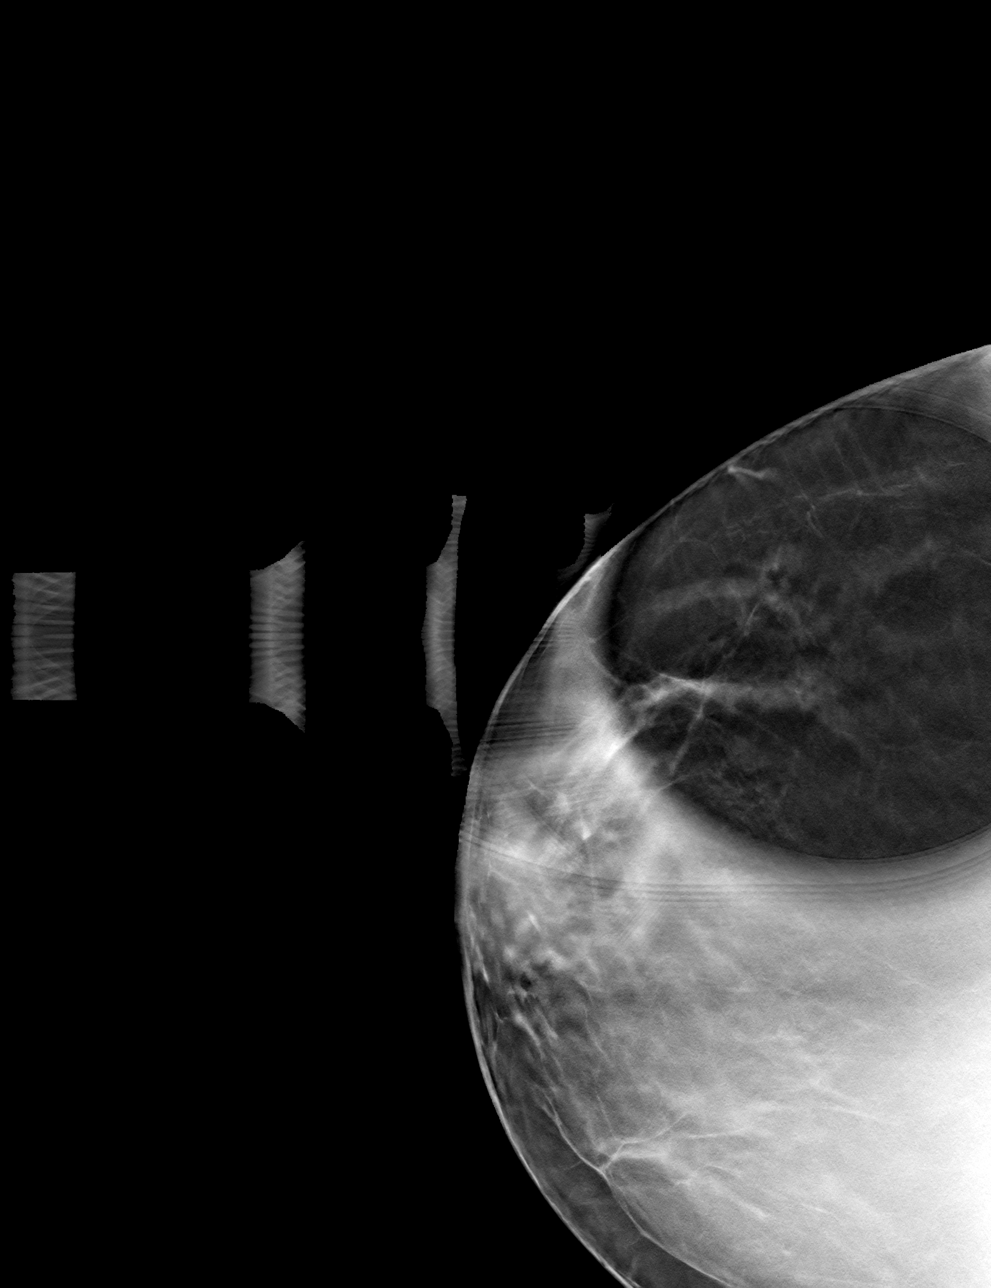

[R ML tomo · tomo slice 37/74.0]
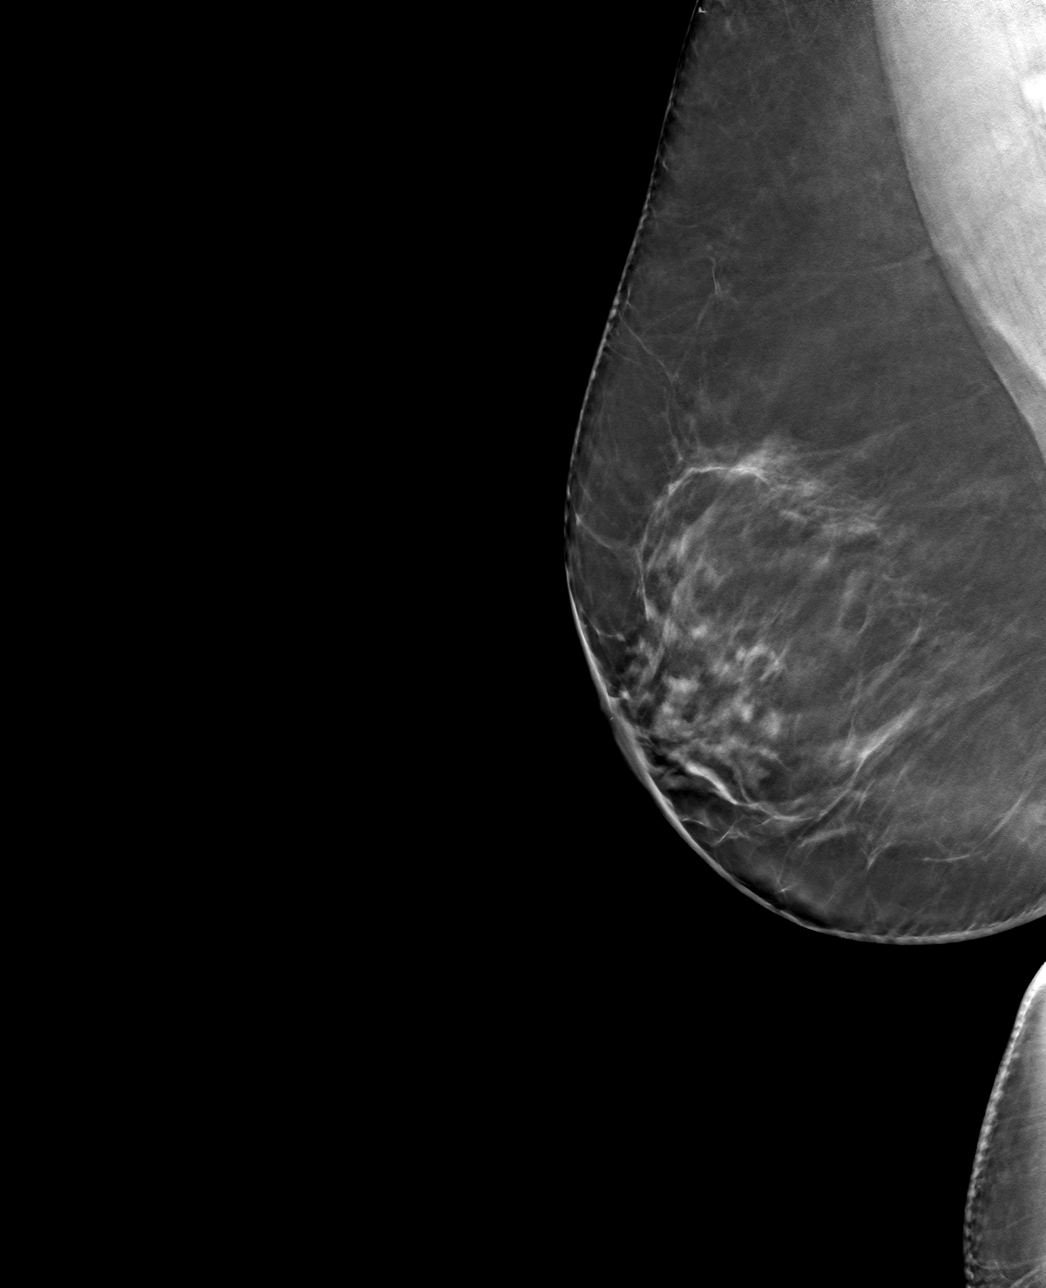

[4 of 12 positions shown; findings below may reference images not displayed]

ACR Breast Density Category b: There are scattered areas of
fibroglandular density.
FINDINGS: Additional imaging of the right breast was obtained. No persistent
mass, distortion or malignant type microcalcifications identified.
IMPRESSION: No evidence of malignancy in the right breast.

RECOMMENDATION:
Bilateral screening mammogram in Sunday March, 2022 is recommended.

I have discussed the findings and recommendations with the patient.
If applicable, a reminder letter will be sent to the patient
regarding the next appointment.

BI-RADS CATEGORY  1: Negative.

## 2024-02-27 ENCOUNTER — Encounter: Payer: Self-pay | Admitting: Obstetrics and Gynecology

## 2024-02-27 ENCOUNTER — Ambulatory Visit (INDEPENDENT_AMBULATORY_CARE_PROVIDER_SITE_OTHER): Payer: BC Managed Care – PPO | Admitting: Obstetrics and Gynecology

## 2024-02-27 VITALS — BP 122/78 | HR 65 | Ht 62.6 in | Wt 205.0 lb

## 2024-02-27 DIAGNOSIS — M8580 Other specified disorders of bone density and structure, unspecified site: Secondary | ICD-10-CM | POA: Diagnosis not present

## 2024-02-27 DIAGNOSIS — N952 Postmenopausal atrophic vaginitis: Secondary | ICD-10-CM

## 2024-02-27 DIAGNOSIS — Z1231 Encounter for screening mammogram for malignant neoplasm of breast: Secondary | ICD-10-CM

## 2024-02-27 DIAGNOSIS — Z01419 Encounter for gynecological examination (general) (routine) without abnormal findings: Secondary | ICD-10-CM

## 2024-02-27 DIAGNOSIS — T148XXA Other injury of unspecified body region, initial encounter: Secondary | ICD-10-CM

## 2024-02-27 NOTE — Progress Notes (Signed)
 68 y.o. y.o. female here for annual exam. Patient's last menstrual period was 10/12/2006 (approximate).     Distant h/o VIN I in 2015.   Patient's last menstrual period was 10/12/2006 (approximate).          Sexually active: No.  The current method of family planning is post menopausal status.    Exercising: No.  The patient does not participate in regular exercise at present. Smoker:  no Denies any PMB  Health Maintenance: Pap:  12/25/17 WNL NEG HPV, 08/29/2015 WNL NEG HPV, 02/26/23 pap smear sent History of abnormal Pap:  no MMG:  05/17/22 Bi-rads 1 neg  BMD:  08/26/22 osteopenic -2.3 normal frax 8.7, 1.0%, no medication, history of left foot fracture with fall down step. Holding granddaughter. She would like to see what April dxa shows. Counseled on treatment options. Body mass index is 36.78 kg/m.    Blood pressure 122/78, pulse 65, height 5' 2.6 (1.59 m), weight 205 lb (93 kg), last menstrual period 10/12/2006, SpO2 96%.     Component Value Date/Time   DIAGPAP  02/26/2023 0936    - Negative for intraepithelial lesion or malignancy (NILM)   DIAGPAP  12/25/2017 0000    NEGATIVE FOR INTRAEPITHELIAL LESIONS OR MALIGNANCY.   HPVHIGH Negative 02/26/2023 0936   ADEQPAP  02/26/2023 0936    Satisfactory for evaluation; transformation zone component PRESENT.   ADEQPAP  12/25/2017 0000    Satisfactory for evaluation  endocervical/transformation zone component PRESENT.    GYN HISTORY:    Component Value Date/Time   DIAGPAP  02/26/2023 0936    - Negative for intraepithelial lesion or malignancy (NILM)   DIAGPAP  12/25/2017 0000    NEGATIVE FOR INTRAEPITHELIAL LESIONS OR MALIGNANCY.   HPVHIGH Negative 02/26/2023 0936   ADEQPAP  02/26/2023 0936    Satisfactory for evaluation; transformation zone component PRESENT.   ADEQPAP  12/25/2017 0000    Satisfactory for evaluation  endocervical/transformation zone component PRESENT.    OB History  Gravida Para Term Preterm AB  Living  1 1 0 0 0 1  SAB IAB Ectopic Multiple Live Births  0 0 0 0 1    # Outcome Date GA Lbr Len/2nd Weight Sex Type Anes PTL Lv  1 Para 1981   7 lb 6 oz (3.345 kg) F Vag-Spont   LIV    Past Medical History:  Diagnosis Date   Abnormal pap 01/2002   cervicitis   Hypothyroid    Migraine with aura    on inderal and prozac   Osteoporosis    never has taken medication tx.    VIN I (vulvar intraepithelial neoplasia I) 12/07/13    Past Surgical History:  Procedure Laterality Date   DILATION AND CURETTAGE OF UTERUS     FOOT SURGERY  2013   left foot plantar fascitis reconstuction of great toe    Current Outpatient Medications on File Prior to Visit  Medication Sig Dispense Refill   Calcium Citrate-Vitamin D (CALCIUM + D PO) Take by mouth.     Cholecalciferol (VITAMIN D-3) 1000 UNITS CAPS Take by mouth.     clobetasol cream (TEMOVATE) 0.05 % 1 Application as needed.     FLUoxetine (PROZAC) 10 MG capsule Take 10 mg by mouth daily.     levothyroxine (SYNTHROID, LEVOTHROID) 88 MCG tablet TAKE 1 TABLET BY MOUTH EVERY MORNING ON EMPTY STOMACH ONCE DAILY  1   lovastatin (MEVACOR) 40 MG tablet Take 40 mg by mouth at bedtime.  propranolol (INDERAL) 10 MG tablet Take 10 mg by mouth 2 (two) times daily.     SUMAtriptan (IMITREX) 100 MG tablet Take 1 tablet by mouth as needed.      Current Facility-Administered Medications on File Prior to Visit  Medication Dose Route Frequency Provider Last Rate Last Admin   denosumab  (PROLIA ) injection 60 mg  60 mg Subcutaneous Once        Romosozumab -aqqg (EVENITY ) 105 MG/1. injection 210 mg  210 mg Subcutaneous Once         Social History   Socioeconomic History   Marital status: Married    Spouse name: Sonam Wandel   Number of children: 1   Years of education: 14   Highest education level: Not on file  Occupational History   Occupation: Economist: [employer]  Tobacco Use   Smoking status: Former    Current  packs/day: 0.00    Average packs/day: 0.5 packs/day for 30.0 years (15.0 ttl pk-yrs)    Types: Cigarettes    Start date: 09/11/1978    Quit date: 09/10/2008    Years since quitting: 15.4   Smokeless tobacco: Never  Vaping Use   Vaping status: Never Used  Substance and Sexual Activity   Alcohol use: Yes    Alcohol/week: 1.0 standard drink of alcohol    Types: 1 Standard drinks or equivalent per week    Comment: occ beer   Drug use: No   Sexual activity: Not Currently    Partners: Male    Birth control/protection: Post-menopausal  Other Topics Concern   Not on file  Social History Narrative   Not on file   Social Drivers of Health   Financial Resource Strain: Not on file  Food Insecurity: No Food Insecurity (10/25/2020)   [facility]   Hunger Vital Sign    Within the past 12 months, you worried that your food would run out before you got the money to buy more.: Never true    Within the past 12 months, the food you bought just didn't last and you didn't have money to get more.: Never true  Transportation Needs: Not on file  Physical Activity: Not on file  Stress: Not on file  Social Connections: Unknown (09/25/2021)   [facility] from [facility]   Social Network    Social Network: Not on file  Intimate Partner Violence: Unknown (08/17/2021)   Received from [facility]   HITS    Physically Hurt: Not on file    Insult or Talk Down To: Not on file    Threaten Physical Harm: Not on file    Scream or Curse: Not on file    Family History  Problem Relation Age of Onset   Cancer Father        skin   Pulmonary fibrosis Father    Other Sister 52       Pre-diabetes (2018)   Diabetes Maternal Uncle    Cancer - Colon Maternal Grandfather        colon     Allergies  Allergen Reactions   Cephalexin      Other Reaction(s): face red and swollen   Ergotamine    Other Nausea And Vomiting    ergomite      Patient's last menstrual period was Patient's last  menstrual period was 10/12/2006 (approximate)..            Review of Systems Alls systems reviewed and are negative.  Physical Exam Constitutional:      Appearance: Normal appearance.  Genitourinary:     Vulva and urethral meatus normal.     No lesions in the vagina.     Right Labia: No rash, lesions or skin changes.    Left Labia: No lesions, skin changes or rash.    No vaginal discharge or tenderness.     No vaginal prolapse present.    Moderate vaginal atrophy present.     Right Adnexa: not tender, not palpable and no mass present.    Left Adnexa: not tender, not palpable and no mass present.    No cervical motion tenderness or discharge.     Uterus is not enlarged, tender or irregular.  Breasts:    Right: Normal.     Left: Normal.  HENT:     Head: Normocephalic.  Neck:     Thyroid : No thyroid  mass, thyromegaly or thyroid  tenderness.  Cardiovascular:     Rate and Rhythm: Normal rate and regular rhythm.     Heart sounds: Normal heart sounds, S1 normal and S2 normal.  Pulmonary:     Effort: Pulmonary effort is normal.     Breath sounds: Normal breath sounds and air entry.  Abdominal:     General: There is no distension.     Palpations: Abdomen is soft. There is no mass.     Tenderness: There is no abdominal tenderness. There is no guarding or rebound.  Musculoskeletal:        General: Normal range of motion.     Cervical back: Full passive range of motion without pain, normal range of motion and neck supple. No tenderness.     Right lower leg: No edema.     Left lower leg: No edema.  Neurological:     Mental Status: She is alert.  Skin:    General: Skin is warm.  Psychiatric:        Mood and Affect: Mood normal.        Behavior: Behavior normal.        Thought Content: Thought content normal.  Vitals and nursing note reviewed. Exam conducted with a chaperone present.       A:         Well Woman GYN exam                             P:        Pap smear  not indicated Encouraged annual mammogram screening Colon cancer screening up-to-date DXA ordered today osteopenia with risks and prior fall fracture. Encouraged bone support medications and counseled on options from fosamax(has reflux), reclast, to eventiy and prolia . She would like to wait until April DXA to determine if she wants to start anyting. Labs and immunizations to do with PMD Discussed breast self exams Encouraged healthy lifestyle practices Encouraged Vit D and Calcium   No follow-ups on file.  Deborah Richards

## 2024-05-28 ENCOUNTER — Ambulatory Visit
Admission: RE | Admit: 2024-05-28 | Discharge: 2024-05-28 | Disposition: A | Discharge status: Home / Self Care | Source: Ambulatory Visit | Attending: Obstetrics and Gynecology | Admitting: Obstetrics and Gynecology

## 2024-05-28 DIAGNOSIS — Z1231 Encounter for screening mammogram for malignant neoplasm of breast: Secondary | ICD-10-CM

## 2024-05-28 DIAGNOSIS — Z01419 Encounter for gynecological examination (general) (routine) without abnormal findings: Secondary | ICD-10-CM

## 2024-06-01 ENCOUNTER — Ambulatory Visit: Payer: Self-pay | Admitting: Obstetrics and Gynecology

## 2025-03-01 ENCOUNTER — Ambulatory Visit: Admitting: Obstetrics and Gynecology
